# Patient Record
Sex: Female | Born: 1950 | Race: White | Hispanic: No | Smoking: Former smoker
Health system: Southern US, Community
[De-identification: ages and names within clinical notes are randomized; demographics above are authoritative.]

## PROBLEM LIST (undated history)

## (undated) DIAGNOSIS — K635 Polyp of colon: Secondary | ICD-10-CM

## (undated) DIAGNOSIS — F32A Depression, unspecified: Secondary | ICD-10-CM

## (undated) DIAGNOSIS — I10 Essential (primary) hypertension: Secondary | ICD-10-CM

## (undated) DIAGNOSIS — M21619 Bunion of unspecified foot: Secondary | ICD-10-CM

## (undated) DIAGNOSIS — E039 Hypothyroidism, unspecified: Secondary | ICD-10-CM

## (undated) DIAGNOSIS — M1990 Unspecified osteoarthritis, unspecified site: Secondary | ICD-10-CM

## (undated) DIAGNOSIS — N6019 Diffuse cystic mastopathy of unspecified breast: Secondary | ICD-10-CM

## (undated) DIAGNOSIS — E7800 Pure hypercholesterolemia, unspecified: Secondary | ICD-10-CM

## (undated) DIAGNOSIS — E049 Nontoxic goiter, unspecified: Secondary | ICD-10-CM

## (undated) DIAGNOSIS — T7840XA Allergy, unspecified, initial encounter: Secondary | ICD-10-CM

## (undated) DIAGNOSIS — F329 Major depressive disorder, single episode, unspecified: Secondary | ICD-10-CM

## (undated) HISTORY — DX: Nontoxic goiter, unspecified: E04.9

## (undated) HISTORY — DX: Major depressive disorder, single episode, unspecified: F32.9

## (undated) HISTORY — DX: Essential (primary) hypertension: I10

## (undated) HISTORY — DX: Bunion of unspecified foot: M21.619

## (undated) HISTORY — PX: INCISION AND DRAINAGE BREAST ABSCESS: SUR672

## (undated) HISTORY — DX: Diffuse cystic mastopathy of unspecified breast: N60.19

## (undated) HISTORY — DX: Pure hypercholesterolemia, unspecified: E78.00

## (undated) HISTORY — DX: Polyp of colon: K63.5

## (undated) HISTORY — PX: THYROIDECTOMY: SHX17

## (undated) HISTORY — PX: TUBAL LIGATION: SHX77

## (undated) HISTORY — DX: Hypothyroidism, unspecified: E03.9

## (undated) HISTORY — PX: BREAST CYST ASPIRATION: SHX578

## (undated) HISTORY — DX: Depression, unspecified: F32.A

## (undated) HISTORY — DX: Unspecified osteoarthritis, unspecified site: M19.90

## (undated) HISTORY — DX: Allergy, unspecified, initial encounter: T78.40XA

---

## 1983-10-14 HISTORY — PX: BREAST BIOPSY: SHX20

## 1999-05-22 ENCOUNTER — Other Ambulatory Visit: Admission: RE | Admit: 1999-05-22 | Discharge: 1999-05-22 | Payer: Self-pay | Admitting: Family Medicine

## 2000-12-11 ENCOUNTER — Other Ambulatory Visit: Admission: RE | Admit: 2000-12-11 | Discharge: 2000-12-11 | Payer: Self-pay | Admitting: Family Medicine

## 2003-05-05 ENCOUNTER — Other Ambulatory Visit: Admission: RE | Admit: 2003-05-05 | Discharge: 2003-05-05 | Payer: Self-pay | Admitting: Family Medicine

## 2005-02-03 ENCOUNTER — Other Ambulatory Visit: Admission: RE | Admit: 2005-02-03 | Discharge: 2005-02-03 | Payer: Self-pay | Admitting: Family Medicine

## 2005-02-03 ENCOUNTER — Ambulatory Visit: Payer: Self-pay | Admitting: Family Medicine

## 2005-02-17 ENCOUNTER — Ambulatory Visit: Payer: Self-pay | Admitting: Family Medicine

## 2005-02-19 ENCOUNTER — Ambulatory Visit: Payer: Self-pay | Admitting: Family Medicine

## 2007-01-19 ENCOUNTER — Ambulatory Visit: Payer: Self-pay | Admitting: Family Medicine

## 2007-01-19 ENCOUNTER — Encounter: Payer: Self-pay | Admitting: Family Medicine

## 2007-01-19 ENCOUNTER — Other Ambulatory Visit: Admission: RE | Admit: 2007-01-19 | Discharge: 2007-01-19 | Payer: Self-pay | Admitting: Family Medicine

## 2007-01-19 LAB — CONVERTED CEMR LAB: Pap Smear: NORMAL

## 2007-01-22 ENCOUNTER — Ambulatory Visit: Payer: Self-pay | Admitting: Family Medicine

## 2007-01-22 LAB — CONVERTED CEMR LAB
ALT: 16 units/L (ref 0–40)
Alkaline Phosphatase: 81 units/L (ref 39–117)
BUN: 14 mg/dL (ref 6–23)
Basophils Absolute: 0 10*3/uL (ref 0.0–0.1)
Bilirubin, Direct: 0.1 mg/dL (ref 0.0–0.3)
Calcium: 9.4 mg/dL (ref 8.4–10.5)
Cholesterol: 194 mg/dL (ref 0–200)
Eosinophils Absolute: 0.5 10*3/uL (ref 0.0–0.6)
GFR calc Af Amer: 112 mL/min
GFR calc non Af Amer: 92 mL/min
HDL: 50.4 mg/dL (ref >39.0)
Lymphocytes Relative: 35.8 % (ref 12.0–46.0)
MCHC: 33.9 g/dL (ref 30.0–36.0)
MCV: 87 fL (ref 78.0–100.0)
Monocytes Absolute: 0.6 10*3/uL (ref 0.2–0.7)
Monocytes Relative: 7.6 % (ref 3.0–11.0)
Neutro Abs: 4 10*3/uL (ref 1.4–7.7)
Platelets: 260 10*3/uL (ref 150–400)
Potassium: 4.4 meq/L (ref 3.5–5.1)
Triglycerides: 80 mg/dL (ref 0–149)
VLDL: 16 mg/dL (ref 0–40)

## 2007-02-03 LAB — FECAL OCCULT BLOOD, GUAIAC: Fecal Occult Blood: NEGATIVE

## 2007-02-05 ENCOUNTER — Ambulatory Visit: Payer: Self-pay | Admitting: Family Medicine

## 2007-04-07 ENCOUNTER — Encounter: Payer: Self-pay | Admitting: Family Medicine

## 2007-04-07 DIAGNOSIS — F329 Major depressive disorder, single episode, unspecified: Secondary | ICD-10-CM

## 2007-04-07 DIAGNOSIS — N6019 Diffuse cystic mastopathy of unspecified breast: Secondary | ICD-10-CM

## 2007-04-07 DIAGNOSIS — E049 Nontoxic goiter, unspecified: Secondary | ICD-10-CM | POA: Insufficient documentation

## 2007-04-07 DIAGNOSIS — M21619 Bunion of unspecified foot: Secondary | ICD-10-CM

## 2007-04-07 DIAGNOSIS — J45909 Unspecified asthma, uncomplicated: Secondary | ICD-10-CM | POA: Insufficient documentation

## 2007-04-07 DIAGNOSIS — F3289 Other specified depressive episodes: Secondary | ICD-10-CM | POA: Insufficient documentation

## 2007-04-20 ENCOUNTER — Ambulatory Visit: Payer: Self-pay | Admitting: Family Medicine

## 2007-04-20 DIAGNOSIS — I10 Essential (primary) hypertension: Secondary | ICD-10-CM | POA: Insufficient documentation

## 2007-06-25 ENCOUNTER — Ambulatory Visit: Payer: Self-pay | Admitting: Family Medicine

## 2007-08-31 ENCOUNTER — Encounter: Payer: Self-pay | Admitting: Family Medicine

## 2008-05-04 ENCOUNTER — Ambulatory Visit: Payer: Self-pay | Admitting: Family Medicine

## 2008-05-08 LAB — CONVERTED CEMR LAB
Alkaline Phosphatase: 72 units/L (ref 39–117)
Basophils Absolute: 0 10*3/uL (ref 0.0–0.1)
Bilirubin, Direct: 0.1 mg/dL (ref 0.0–0.3)
Calcium: 9.4 mg/dL (ref 8.4–10.5)
Cholesterol: 242 mg/dL (ref 0–200)
GFR calc Af Amer: 95 mL/min
GFR calc non Af Amer: 79 mL/min
Glucose, Bld: 84 mg/dL (ref 70–99)
HCT: 39.9 % (ref 36.0–46.0)
Hemoglobin: 13.7 g/dL (ref 12.0–15.0)
MCHC: 34.2 g/dL (ref 30.0–36.0)
Monocytes Absolute: 0.5 10*3/uL (ref 0.1–1.0)
Monocytes Relative: 6.5 % (ref 3.0–12.0)
Neutro Abs: 4.4 10*3/uL (ref 1.4–7.7)
Platelets: 263 10*3/uL (ref 150–400)
Potassium: 3.9 meq/L (ref 3.5–5.1)
RDW: 12.4 % (ref 11.5–14.6)
Sodium: 140 meq/L (ref 135–145)
Total Bilirubin: 0.7 mg/dL (ref 0.3–1.2)
Total CHOL/HDL Ratio: 5.9
Triglycerides: 145 mg/dL (ref 0–149)
VLDL: 29 mg/dL (ref 0–40)

## 2008-05-18 ENCOUNTER — Ambulatory Visit: Payer: Self-pay | Admitting: Family Medicine

## 2008-05-18 LAB — CONVERTED CEMR LAB
OCCULT 1: NEGATIVE
OCCULT 2: NEGATIVE
OCCULT 3: NEGATIVE

## 2008-05-24 ENCOUNTER — Encounter: Payer: Self-pay | Admitting: Family Medicine

## 2008-06-01 ENCOUNTER — Encounter: Payer: Self-pay | Admitting: Family Medicine

## 2008-09-04 ENCOUNTER — Ambulatory Visit: Payer: Self-pay | Admitting: Family Medicine

## 2008-09-04 DIAGNOSIS — E039 Hypothyroidism, unspecified: Secondary | ICD-10-CM

## 2008-09-04 DIAGNOSIS — E7800 Pure hypercholesterolemia, unspecified: Secondary | ICD-10-CM

## 2008-09-05 LAB — CONVERTED CEMR LAB
ALT: 13 units/L (ref 0–35)
AST: 15 units/L (ref 0–37)
Albumin: 4.3 g/dL (ref 3.5–5.2)
Alkaline Phosphatase: 85 units/L (ref 39–117)
Cholesterol: 244 mg/dL — ABNORMAL HIGH (ref 0–200)
Free T4: 1.55 ng/dL (ref 0.89–1.80)
HDL: 57 mg/dL (ref >39)
LDL Cholesterol: 166 mg/dL — ABNORMAL HIGH (ref 0–99)
Total Protein: 7 g/dL (ref 6.0–8.3)
Triglycerides: 107 mg/dL (ref <150)

## 2009-06-11 ENCOUNTER — Ambulatory Visit: Payer: Self-pay | Admitting: Family Medicine

## 2009-06-11 ENCOUNTER — Other Ambulatory Visit: Admission: RE | Admit: 2009-06-11 | Discharge: 2009-06-11 | Payer: Self-pay | Admitting: Family Medicine

## 2009-06-11 ENCOUNTER — Encounter: Payer: Self-pay | Admitting: Family Medicine

## 2009-06-12 LAB — CONVERTED CEMR LAB
ALT: 15 units/L (ref 0–35)
AST: 17 units/L (ref 0–37)
BUN: 12 mg/dL (ref 6–23)
Basophils Relative: 0.4 % (ref 0.0–3.0)
Bilirubin, Direct: 0 mg/dL (ref 0.0–0.3)
Eosinophils Relative: 3.1 % (ref 0.0–5.0)
GFR calc non Af Amer: 78.46 mL/min (ref >60)
HCT: 40.3 % (ref 36.0–46.0)
HDL: 46.3 mg/dL (ref >39.00)
Monocytes Relative: 6.4 % (ref 3.0–12.0)
Neutrophils Relative %: 61.3 % (ref 43.0–77.0)
Platelets: 230 10*3/uL (ref 150.0–400.0)
Potassium: 4.2 meq/L (ref 3.5–5.1)
RBC: 4.52 M/uL (ref 3.87–5.11)
Total Bilirubin: 0.6 mg/dL (ref 0.3–1.2)
Total CHOL/HDL Ratio: 5
Total Protein: 7.1 g/dL (ref 6.0–8.3)
VLDL: 30.2 mg/dL (ref 0.0–40.0)
WBC: 9.1 10*3/uL (ref 4.5–10.5)

## 2009-06-13 ENCOUNTER — Encounter (INDEPENDENT_AMBULATORY_CARE_PROVIDER_SITE_OTHER): Payer: Self-pay | Admitting: *Deleted

## 2009-06-22 ENCOUNTER — Encounter: Payer: Self-pay | Admitting: Family Medicine

## 2009-06-27 ENCOUNTER — Encounter (INDEPENDENT_AMBULATORY_CARE_PROVIDER_SITE_OTHER): Payer: Self-pay | Admitting: *Deleted

## 2009-08-10 ENCOUNTER — Encounter: Payer: Self-pay | Admitting: Family Medicine

## 2009-08-13 HISTORY — PX: COLONOSCOPY: SHX174

## 2009-08-29 ENCOUNTER — Ambulatory Visit: Payer: Self-pay | Admitting: Gastroenterology

## 2009-08-29 ENCOUNTER — Encounter: Payer: Self-pay | Admitting: Family Medicine

## 2009-08-29 LAB — HM COLONOSCOPY

## 2009-09-12 ENCOUNTER — Ambulatory Visit: Payer: Self-pay | Admitting: Family Medicine

## 2009-09-13 LAB — CONVERTED CEMR LAB
Cholesterol: 226 mg/dL — ABNORMAL HIGH (ref 0–200)
HDL: 51.6 mg/dL (ref >39.00)
Total CHOL/HDL Ratio: 4
Triglycerides: 93 mg/dL (ref 0.0–149.0)
VLDL: 18.6 mg/dL (ref 0.0–40.0)

## 2009-09-18 ENCOUNTER — Telehealth: Payer: Self-pay | Admitting: Family Medicine

## 2010-08-06 ENCOUNTER — Telehealth (INDEPENDENT_AMBULATORY_CARE_PROVIDER_SITE_OTHER): Payer: Self-pay | Admitting: *Deleted

## 2010-08-09 ENCOUNTER — Ambulatory Visit: Payer: Self-pay | Admitting: Family Medicine

## 2010-08-12 LAB — CONVERTED CEMR LAB
ALT: 14 units/L (ref 0–35)
Basophils Relative: 0.4 % (ref 0.0–3.0)
Bilirubin, Direct: 0.1 mg/dL (ref 0.0–0.3)
Chloride: 101 meq/L (ref 96–112)
Creatinine, Ser: 0.9 mg/dL (ref 0.4–1.2)
Direct LDL: 138.8 mg/dL
Eosinophils Relative: 2.9 % (ref 0.0–5.0)
HCT: 40.8 % (ref 36.0–46.0)
Hemoglobin: 13.8 g/dL (ref 12.0–15.0)
MCV: 88.3 fL (ref 78.0–100.0)
Monocytes Absolute: 0.5 10*3/uL (ref 0.1–1.0)
Neutrophils Relative %: 58.1 % (ref 43.0–77.0)
Potassium: 4.9 meq/L (ref 3.5–5.1)
RBC: 4.62 M/uL (ref 3.87–5.11)
Sodium: 138 meq/L (ref 135–145)
Total CHOL/HDL Ratio: 5
Total Protein: 6.9 g/dL (ref 6.0–8.3)
VLDL: 16.2 mg/dL (ref 0.0–40.0)
WBC: 7.5 10*3/uL (ref 4.5–10.5)

## 2010-08-14 ENCOUNTER — Ambulatory Visit: Payer: Self-pay | Admitting: Family Medicine

## 2010-09-25 ENCOUNTER — Encounter: Payer: Self-pay | Admitting: Family Medicine

## 2010-09-26 ENCOUNTER — Encounter: Payer: Self-pay | Admitting: Family Medicine

## 2010-10-22 ENCOUNTER — Ambulatory Visit
Admission: RE | Admit: 2010-10-22 | Discharge: 2010-10-22 | Payer: Self-pay | Source: Home / Self Care | Attending: Family Medicine | Admitting: Family Medicine

## 2010-11-12 NOTE — Progress Notes (Signed)
----   Converted from flag ---- ---- 08/05/2010 7:50 PM, Colon Flattery Tower MD wrote: please check wellness and lipid 272 and v70.0  ---- 08/01/2010 11:51 AM, Liane Comber CMA (AAMA) wrote: Lab orders please! Good Morning! This pt is scheduled for cpx labs Friday, which labs to draw and dx codes to use? Thanks Tasha ------------------------------

## 2010-11-12 NOTE — Assessment & Plan Note (Signed)
Summary: CPX/CLE   Vital Signs:  Patient profile:   60 year old female Height:      64.25 inches Weight:      183.25 pounds BMI:     31.32 Temp:     98.6 degrees F oral Pulse rate:   68 / minute Pulse rhythm:   regular BP sitting:   114 / 74  (left arm) Cuff size:   regular  Vitals Entered By: Lewanda Rife LPN (August 14, 2010 10:39 AM) CC: CPX LMP 10 yrs ago   History of Present Illness: here for health mt exam and to rev chronic med problems   wt is down 12 lb is really working hard on that  healthy diet and exercise  husb dx with Dm -- sugar is out of the house   bp well controlled 114/74 toay  hypothyroid with goiter - endo  nothing new with thyroid  tsh is theraputic   dexa nl in 9/10 ca and D-- is good about that   colonosc - polyp 11/10 adenomatous ? f/u 5 years   pap neg 8/10  no abn paps -- and last 3 have been nl  no new partners or symptoms   mam nl 9/10- wants to set that up self exam - no lumps or problems   /td06 flu shot -- [place] [facility] [place]   lipids this check with trig 81 and HDL 45 and LDL 138 (this is down from 160s !) no more red meat more veg and fruits  all whole grain items      Allergies (verified): No Known Drug Allergies  Past History:  Past Medical History: Last updated: 09/07/2009 Asthma Depression hypertension  bunions hypothyroid- hx of goiter/surg    endo- Dr Juleen China [facility]- [facility]  Past Surgical History: Last updated: 09/07/2009 Tubal ligation Thyroidectomy Breast abscess- right, surgery Thyroid uptake scan- neg (08/1999) Thyroid scan- multinodular goiter (02/2001) Dexa- normal (12/2001) 11/10 colonoscopy polyp  Family History: Last updated: 04/20/2007 Father: mesothelioma Mother: anaplastic thyroid cancer, ? 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 Eyes:  Denies blurring and eye irritation. CV:  Denies chest pain or discomfort, lightheadness, and palpitations. Resp:  Denies cough, shortness of breath, and wheezing. GI:  Denies abdominal pain, bloody stools, change in bowel habits, indigestion, and nausea. GU:  Denies abnormal vaginal bleeding, discharge, dysuria, and urinary frequency. MS:  Denies joint pain, joint redness, joint swelling, muscle aches, and cramps. Derm:  Denies itching, lesion(s), poor wound healing, and rash. Neuro:  Denies numbness and tingling. Psych:  mood is overall good . Endo:  Denies cold intolerance, excessive thirst, excessive urination, and heat intolerance.  Physical Exam  General:  Well-developed,well-nourished,in no acute distress; alert,appropriate and cooperative throughout examination Head:  normocephalic, atraumatic, and no abnormalities observed.   Eyes:  vision grossly intact, pupils equal, pupils round, and pupils reactive to light.  no conjunctival pallor, injection or icterus  Ears:  R ear normal and L ear normal.   Nose:  no nasal discharge.   Mouth:  pharynx pink and moist.   Neck:  supple with full rom and no masses or thyromegally, no JVD or carotid bruit  Chest Wall:  No deformities, masses, or tenderness noted. Breasts:  No mass, nodules, thickening, tenderness, bulging, retraction, inflamation, nipple discharge or skin changes noted.   Lungs:  Normal respiratory effort, chest expands symmetrically. Lungs are clear to auscultation, no crackles or wheezes. Heart:  Normal rate and regular rhythm. S1 and S2 normal without gallop, murmur, click, rub or other extra sounds. Abdomen:  Bowel sounds positive,abdomen soft and non-tender without masses, organomegaly or hernias noted. no renal bruits  Msk:  No  deformity or scoliosis noted of thoracic or lumbar spine.  no acute joint changes  no joint tenderness  Pulses:  R and L carotid,radial,femoral,dorsalis pedis and posterior tibial pulses are full and equal bilaterally Extremities:  No clubbing, cyanosis, edema, or deformity noted with normal full range of motion of all joints.   Neurologic:  sensation intact to light touch, gait normal, and DTRs symmetrical and normal.   Skin:  Intact without suspicious lesions or rashes Cervical Nodes:  No lymphadenopathy noted Axillary Nodes:  No palpable lymphadenopathy Inguinal Nodes:  No significant adenopathy Psych:  normal affect, talkative and pleasant    Impression & Recommendations:  Problem # 1:  HEALTH MAINTENANCE EXAM (ICD-V70.0) Assessment Comment Only reviewed health habits including diet, exercise and skin cancer prevention reviewed health maintenance list and family history commended on wt loss  labs reviewed  pt will get flu shot soon  Problem # 2:  PURE HYPERCHOLESTEROLEMIA (ICD-272.0) Assessment: Improved  improved with diet and exercise - commended on that  rev low sat fat diet - will keep working on that   Labs Reviewed: SGOT: 19 (08/09/2010)   SGPT: 14 (08/09/2010)   HDL:45.90 (08/09/2010), 51.60 (09/12/2009)  LDL:166 (09/04/2008), DEL (05/04/2008)  Chol:207 (08/09/2010), 226 (09/12/2009)  Trig:81.0 (08/09/2010), 93.0 (09/12/2009)  Problem # 3:  OTHER SCREENING MAMMOGRAM (ICD-V76.12) Assessment: Comment Only annual mammogram scheduled adv pt to continue regular self breast exams non remarkable breast exam today  Orders: Radiology Referral (Radiology)  Problem # 4:  UNSPECIFIED HYPOTHYROIDISM (ICD-244.9) Assessment: Comment Only is stable - euthyroid and continues f/u with Dr Juleen China Her updated medication list for this problem includes:    Synthroid 137 Mcg Tabs (Levothyroxine sodium) .Marland Kitchen... Take one by mouth daily  Problem # 5:  HYPERTENSION, BENIGN ESSENTIAL  (ICD-401.1) Assessment: Unchanged  well controlled with low dose ace  no changes  lab rev Her updated medication list for this problem includes:    Zestril 5 Mg Tabs (Lisinopril) .Marland Kitchen... 1 by mouth once daily  BP today: 114/74 Prior BP: 120/82 (06/11/2009)  Labs Reviewed: K+: 4.9 (08/09/2010) Creat: : 0.9 (08/09/2010)   Chol: 207 (08/09/2010)   HDL: 45.90 (08/09/2010)   LDL: 166 (09/04/2008)   TG: 81.0 (08/09/2010)  Problem # 6:  ASTHMA (ICD-493.90) Assessment: Unchanged  renew inhaler for occ exp to dogs  Her updated medication list for this problem includes:    Proventil Hfa 108 (90 Base) Mcg/act Aers (Albuterol sulfate) .Marland Kitchen... 2 puffs up to every 4 hours as needed for wheezing  Orders: Prescription Created Electronically [id number])  Complete Medication List: 1)  Synthroid 137 Mcg Tabs (Levothyroxine sodium) .... Take one by mouth daily 2)  Fish Oil Caps (Omega-3 fatty acids caps) .... Take by mouth as directed 3)  Vitamin C 500 Mg Tabs (Ascorbic acid) .... Take one by mouth twice a day 4)  Calcium 1000 Mg  .... Take one by mouth daily 5)  Zestril 5 Mg Tabs (Lisinopril) .Marland Kitchen.. 1 by mouth once daily 6)  Proventil Hfa 108 (90  Base) Mcg/act Aers (Albuterol sulfate) .... 2 puffs up to every 4 hours as needed for wheezing 7)  Ginkoba 40 Mg Tabs (Ginkgo biloba) .... Take 1 tablet by mouth once a day 8)  Cinsulin Otc (cinnamon Extract)  .... Take 1 tablet by mouth once a day  Patient Instructions: 1)  make sure to get your flu shot  2)  keep up the great work with healthy diet and exercise  3)  cholesterol is looking better 4)  we will schedule mammogram at check out  Prescriptions: ZESTRIL 5 MG  TABS (LISINOPRIL) 1 by mouth once daily  #30 x 11   Entered and Authorized by:   Judith Part MD   Signed by:   Judith Part MD on 08/14/2010   Method used:   Electronically to        [facility]. [address] [phone]* (retail)       [address] [place], [place]  [id number]        Ph: [phone]       Fax: [phone]   RxID:   [id number] PROVENTIL HFA 108 (90 BASE) MCG/ACT  AERS (ALBUTEROL SULFATE) 2 puffs up to every 4 hours as needed for wheezing  #1 mdi x 11   Entered and Authorized by:   Judith Part MD   Signed by:   Judith Part MD on 08/14/2010   Method used:   Electronically to        [facility]. [address] [phone]* (retail)       [address] [place], [place]  [id number]       Ph: [phone]       Fax: [phone]   RxID:   [id number]    Orders Added: 1)  Radiology Referral [Radiology] 2)  Prescription Created Electronically [G8553] 3)  Est. Patient Level IV [70350]    Current Allergies (reviewed today): No known allergies

## 2010-11-14 NOTE — Assessment & Plan Note (Signed)
Summary: WHEEZING,S.O.B.,COUGH/CLE   Vital Signs:  Patient profile:   60 year old female Height:      64.25 inches Weight:      192.25 pounds BMI:     32.86 O2 Sat:      98 % on Room air Temp:     98 degrees F oral Pulse rate:   68 / minute Pulse rhythm:   regular Resp:     16 per minute BP sitting:   122 / 86  (left arm) Cuff size:   large  Vitals Entered By: Delilah Shan CMA Duncan Dull) (October 22, 2010 9:13 AM)  O2 Flow:  Room air CC: Wheezing, SOB, cough  -  worse at night.   History of Present Illness: H/o RAD:  Better today but prev with need for SABA- wheeze triggered by dogs.  Inc time around dogs over the holidays.  Wheeze noted at night.  Also with some congestion in chest.  Using the inhaler with temp relief.  Using it several times day.  Dec in cough in the day relative to nighttime.  No facial congestion.  No FCNAVD, no ear pain.  Didn't get a flu shot when offered at work.    Allergies: No Known Drug Allergies  Past History:  Past Medical History: Asthma- related to pet/dog exposure Depression hypertension  bunions hypothyroid- hx of goiter/surg    endo- Dr Juleen China [facility]- [facility]  Review of Systems       See HPI.  Otherwise negative.    Physical Exam  General:  GEN: nad, alert and oriented HEENT: mucous membranes moist, tm wnl x2 OP wnl NECK: supple w/o LA CV: rrr.  PULM: bilateral exp wheeze but no focal decrease in bs, no increase in wob EXT: no edema SKIN: no acute rash    Impression & Recommendations:  Problem # 1:  ASTHMA (ICD-493.90) Start flovent 1 puff two times a day and rinse after use.  Call back if not improved.  Use SABA as needed in meantime.  She agrees.  We discussed proph vs abortive tx and routine use.  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  She agrees with plan . Her updated medication list for this problem includes:  Proventil Hfa 108 (90 Base) Mcg/act Aers (Albuterol sulfate) .Marland Kitchen... 2 puffs up to every 4 hours as needed for wheezing    Flovent Diskus 250 Mcg/blist Aepb (Fluticasone propionate (inhal)) .Marland Kitchen... 1 puff two times a day and rinse after use  Complete Medication List: 1)  Synthroid 137 Mcg Tabs (Levothyroxine sodium) .... Take one by mouth daily 2)  Fish Oil Caps (Omega-3 fatty acids caps) .... Take by mouth as directed 3)  Vitamin C 500 Mg Tabs (Ascorbic acid) .... Take one by mouth twice a day 4)  Calcium 1000 Mg  .... Take one by mouth daily 5)  Zestril 5 Mg Tabs (Lisinopril) .Marland Kitchen.. 1 by mouth once daily 6)  Proventil Hfa 108 (90 Base) Mcg/act Aers (Albuterol sulfate) .... 2 puffs up to every 4 hours as needed for wheezing 7)  Ginkoba 40 Mg Tabs (Ginkgo biloba) .... Take 1 tablet by mouth once a day 8)  Cinsulin Otc (cinnamon Extract)  .... Take 1 tablet by mouth once a day 9)  Flovent Diskus 250 Mcg/blist Aepb (Fluticasone propionate (inhal)) .Marland Kitchen.. 1 puff two times a day and rinse after use  Patient Instructions: 1)  Start flovent  1 puff two times a day and rinse after use.  Use the albuterol as needed.  Let me know if you aren't improving.  Take care.

## 2010-11-14 NOTE — Letter (Signed)
Summary: Results Follow up Letter  [place] [facility]  [address] [place], [place] [address]   Phone: [phone]  Fax: [phone]    09/26/2010 MRN: [id number]    [facility] [address] [place], [place]  [address]    Dear Ms. Suddeth,  The following are the results of your recent test(s):  Test         Result    Pap Smear:        Normal _____  Not Normal _____ Comments: ______________________________________________________ Cholesterol: LDL(Bad cholesterol):         Your goal is less than:         HDL (Good cholesterol):       Your goal is more than: Comments:  ______________________________________________________ Mammogram:        Normal _X_  Not Normal _____ Comments:Repeat in one year.   ___________________________________________________________________ Hemoccult:        Normal _____  Not normal _______ Comments:    _____________________________________________________________________ Other Tests:    We routinely do not discuss normal results over the telephone.  If you desire a copy of the results, or you have any questions about this information we can discuss them at your next office visit.   Sincerely,    Idamae Schuller Tower,MD  MT/ri

## 2010-11-21 ENCOUNTER — Encounter: Payer: Self-pay | Admitting: Family Medicine

## 2010-11-21 ENCOUNTER — Ambulatory Visit (INDEPENDENT_AMBULATORY_CARE_PROVIDER_SITE_OTHER): Payer: PRIVATE HEALTH INSURANCE | Admitting: Family Medicine

## 2010-11-21 DIAGNOSIS — M25569 Pain in unspecified knee: Secondary | ICD-10-CM

## 2010-11-28 NOTE — Assessment & Plan Note (Signed)
Summary: INJURED KNEE   Vital Signs:  Patient profile:   60 year old female Weight:      192.50 pounds Temp:     98.3 degrees F oral Pulse rate:   78 / minute Pulse rhythm:   regular BP sitting:   110 / 80  (left arm) Cuff size:   large  Vitals Entered By: Selena Batten Dance CMA Duncan Dull) (November 21, 2010 11:55 AM) CC: Left knee pain   History of Present Illness: CC: L knee pain  1 year on and off isues with L knee.  No h/o joint pains prior.  started after dancing on video game.  Used to walk every morning 5 x/wk for several years.  Started dance mix class, felt hurt knee.  This Monday hurt knee again at dance mix class.  Was jumping and turning when felt sharp pain lateral and posterior L knee.  Feels stiffness when standing for long time.  Walking straight no pain.  No popping or locking, no significant instability.  + cracking of knee.  taken ibuprofen 2 pills q4 hours for a few days.  no heat or ice.  No surgeries to knee in past, no fevers/chills.  no smokers at home.    Current Medications (verified): 1)  Synthroid 137 Mcg  Tabs (Levothyroxine Sodium) .... Take One By Mouth Daily 2)  Fish Oil   Caps (Omega-3 Fatty Acids Caps) .... Take By Mouth As Directed 3)  Vitamin C 500 Mg  Tabs (Ascorbic Acid) .... Take One By Mouth Twice A Day 4)  Calcium 1000 Mg .... Take One By Mouth Daily 5)  Zestril 5 Mg  Tabs (Lisinopril) .Marland Kitchen.. 1 By Mouth Once Daily 6)  Proventil Hfa 108 (90 Base) Mcg/act  Aers (Albuterol Sulfate) .... 2 Puffs Up To Every 4 Hours As Needed For Wheezing 7)  Ginkoba 40 Mg Tabs (Ginkgo Biloba) .... Take 1 Tablet By Mouth Once A Day 8)  Cinsulin Otc (Cinnamon Extract) .... 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  neg mcmurray's, neg drawer test, neg patellofemoral grind, no patellar subluxation.  no ligamentous laxity.  no effusion.  no popliteal fullness.  mild crepitus to knees at flexion/extension but full range of motion.  mild instability with twisting and standing on L leg.  feels some discomfort lateral posterior knee with this movement. Pulses:  2+ DP/PT Extremities:  No clubbing, cyanosis, edema, or deformity noted with normal full range of motion of all joints.   Impression & Recommendations:  Problem # 1:  KNEE PAIN, LEFT (ICD-719.46) Assessment New no acute injury, no marked pathology on exam today.  ? mild iliotibial band vs ligament strain vs micro meniscal tear vs early osteoarthritis.  treat for now conservatively with NSAIDs, brace for support.  rest and ice, then slowly start activity again.  if pain returns, return for further eval - consider xrays.  Complete Medication  List: 1)  Synthroid 137 Mcg Tabs (Levothyroxine sodium) .... Take one by mouth daily 2)  Fish Oil Caps (Omega-3 fatty acids caps) .... Take by mouth as directed 3)  Vitamin C 500 Mg Tabs (Ascorbic acid) .... Take one by mouth twice a day 4)  Calcium 1000 Mg  .... Take one by mouth daily 5)  Zestril 5 Mg Tabs (Lisinopril) .Marland Kitchen.. 1 by mouth once daily 6)  Proventil Hfa 108 (90 Base) Mcg/act Aers (Albuterol sulfate) .... 2 puffs up to every 4 hours as needed for wheezing 7)  Ginkoba 40 Mg Tabs (Ginkgo biloba) .... Take 1 tablet by mouth once a day 8)  Cinsulin Otc (cinnamon Extract)  .... Take 1 tablet by mouth once a  day  Patient Instructions: 1)  This could be beginnings of arthritis or micro meniscal tear. 2)  Start with continued ibuprofen as well as using neoprene knee brace when exercising for extra support. 3)  If not improving or if worsening, let us know.   Orders Added: 1)  Est. Patient Level III [96295]    Current Allergies (reviewed today): No known allergies

## 2011-08-14 ENCOUNTER — Telehealth: Payer: Self-pay | Admitting: Family Medicine

## 2011-08-14 DIAGNOSIS — Z0000 Encounter for general adult medical examination without abnormal findings: Secondary | ICD-10-CM | POA: Insufficient documentation

## 2011-08-14 DIAGNOSIS — E7800 Pure hypercholesterolemia, unspecified: Secondary | ICD-10-CM

## 2011-08-14 DIAGNOSIS — E039 Hypothyroidism, unspecified: Secondary | ICD-10-CM

## 2011-08-14 NOTE — Telephone Encounter (Signed)
Message copied by Judy Pimple on Thu Aug 14, 2011  8:55 PM ------      Message from: Baldomero Lamy      Created: Thu Aug 14, 2011  9:38 AM      Regarding: Cpx labs Fri 08/15/11       Please order  future cpx labs for pt's upcomming lab appt.      Thanks      Rodney Booze

## 2011-08-15 ENCOUNTER — Other Ambulatory Visit (INDEPENDENT_AMBULATORY_CARE_PROVIDER_SITE_OTHER): Payer: PRIVATE HEALTH INSURANCE

## 2011-08-15 DIAGNOSIS — E7800 Pure hypercholesterolemia, unspecified: Secondary | ICD-10-CM

## 2011-08-15 DIAGNOSIS — E039 Hypothyroidism, unspecified: Secondary | ICD-10-CM

## 2011-08-15 DIAGNOSIS — Z0000 Encounter for general adult medical examination without abnormal findings: Secondary | ICD-10-CM

## 2011-08-15 LAB — LIPID PANEL
Cholesterol: 224 mg/dL — ABNORMAL HIGH (ref 0–200)
HDL: 56.3 mg/dL (ref >39.00)
Triglycerides: 111 mg/dL (ref 0.0–149.0)
VLDL: 22.2 mg/dL (ref 0.0–40.0)

## 2011-08-15 LAB — CBC WITH DIFFERENTIAL/PLATELET
Basophils Absolute: 0 10*3/uL (ref 0.0–0.1)
Basophils Relative: 0.4 % (ref 0.0–3.0)
HCT: 41.3 % (ref 36.0–46.0)
Hemoglobin: 13.8 g/dL (ref 12.0–15.0)
Lymphocytes Relative: 34.2 % (ref 12.0–46.0)
Lymphs Abs: 2.5 10*3/uL (ref 0.7–4.0)
Monocytes Relative: 8.2 % (ref 3.0–12.0)
Neutro Abs: 3.8 10*3/uL (ref 1.4–7.7)
RBC: 4.61 Mil/uL (ref 3.87–5.11)
RDW: 13.5 % (ref 11.5–14.6)

## 2011-08-15 LAB — COMPREHENSIVE METABOLIC PANEL
ALT: 17 U/L (ref 0–35)
BUN: 15 mg/dL (ref 6–23)
CO2: 27 mEq/L (ref 19–32)
Calcium: 9.3 mg/dL (ref 8.4–10.5)
Chloride: 104 mEq/L (ref 96–112)
Creatinine, Ser: 0.8 mg/dL (ref 0.4–1.2)
GFR: 73.61 mL/min (ref >60.00)
Glucose, Bld: 96 mg/dL (ref 70–99)
Total Bilirubin: 0.6 mg/dL (ref 0.3–1.2)

## 2011-08-15 LAB — LDL CHOLESTEROL, DIRECT: Direct LDL: 151.7 mg/dL

## 2011-08-21 ENCOUNTER — Encounter: Payer: Self-pay | Admitting: Family Medicine

## 2011-08-22 ENCOUNTER — Encounter: Payer: Self-pay | Admitting: Family Medicine

## 2011-08-22 ENCOUNTER — Ambulatory Visit (INDEPENDENT_AMBULATORY_CARE_PROVIDER_SITE_OTHER): Payer: PRIVATE HEALTH INSURANCE | Admitting: Family Medicine

## 2011-08-22 DIAGNOSIS — I10 Essential (primary) hypertension: Secondary | ICD-10-CM

## 2011-08-22 DIAGNOSIS — Z1231 Encounter for screening mammogram for malignant neoplasm of breast: Secondary | ICD-10-CM | POA: Insufficient documentation

## 2011-08-22 DIAGNOSIS — R220 Localized swelling, mass and lump, head: Secondary | ICD-10-CM

## 2011-08-22 DIAGNOSIS — E7800 Pure hypercholesterolemia, unspecified: Secondary | ICD-10-CM

## 2011-08-22 DIAGNOSIS — R221 Localized swelling, mass and lump, neck: Secondary | ICD-10-CM | POA: Insufficient documentation

## 2011-08-22 DIAGNOSIS — Z0000 Encounter for general adult medical examination without abnormal findings: Secondary | ICD-10-CM

## 2011-08-22 DIAGNOSIS — E039 Hypothyroidism, unspecified: Secondary | ICD-10-CM

## 2011-08-22 MED ORDER — LISINOPRIL 5 MG PO TABS: 5.0000 mg | ORAL_TABLET | Freq: Every day | ORAL | Status: DC

## 2011-08-22 MED ORDER — ALBUTEROL SULFATE HFA 108 (90 BASE) MCG/ACT IN AERS: 2.0000 | INHALATION_SPRAY | Freq: Four times a day (QID) | RESPIRATORY_TRACT | Status: DC | PRN

## 2011-08-22 NOTE — Patient Instructions (Addendum)
Avoid red meat/ fried foods/ egg yolks/ fatty breakfast meats/ butter, cheese and high fat dairy/ and shellfish   Change eggs to egg beaters and consider [place] bacon or sausage Lean pork is ok  keep up the good exercise  We will schedule mammogram at check out  If the knot in your neck (I suspect a lymph node) gets bigger or sore let me know  Schedule fasting lab and then follow up in 3 months

## 2011-08-22 NOTE — Assessment & Plan Note (Signed)
theraputic tsh No change in dose No clinical changes

## 2011-08-22 NOTE — Assessment & Plan Note (Signed)
bp in fair control at this time  No changes needed  Disc lifstyle change with low sodium diet and exercise  Labs reviewed  

## 2011-08-22 NOTE — Assessment & Plan Note (Signed)
Small mobile non tender lump in L neck post-lat - resembles reactive LN  Pt will watch F/u 3 mo re check

## 2011-08-22 NOTE — Progress Notes (Signed)
Subjective:    Patient ID: Jodi Steele, female    DOB: 1951/08/16, 60 y.o.   MRN: [id number]  HPI Here for annual health mt exam and also to review chronic medical problems  Has been feeling ok - feeling fine   More asthma this time of year - uses her MDI more often  Uses it once per day average  Some trouble with mechanism of the device   HTN in good control 128/80 on ace  Wt is down 5 lb with bmi of 31 Diet- is eating healthy  Exercise- goes to gym and does floor exercise at home   Lipids Lab Results  Component Value Date   CHOL 224* 08/15/2011   CHOL 207* 08/09/2010   CHOL 226* 09/12/2009   Lab Results  Component Value Date   HDL 56.30 08/15/2011   HDL 40.98 08/09/2010   HDL 51.60 09/12/2009   Lab Results  Component Value Date   LDLCALC 166* 09/04/2008   LDLCALC 128* 01/22/2007   Lab Results  Component Value Date   TRIG 111.0 08/15/2011   TRIG 81.0 08/09/2010   TRIG 93.0 09/12/2009   Lab Results  Component Value Date   CHOLHDL 4 08/15/2011   CHOLHDL 5 08/09/2010   CHOLHDL 4 09/12/2009   Lab Results  Component Value Date   LDLDIRECT 151.7 08/15/2011   LDLDIRECT 138.8 08/09/2010   LDLDIRECT 166.1 09/12/2009   LDL is up generally as is HDL- is exercising more  Does stay away from high sat fat foods  Eats lots of fish / all whole grains   Hypothyroid Lab Results  Component Value Date   TSH 1.64 08/15/2011   theraputic Clinically  No change in dose  Mam 12/11 nl  - wants to go to [facility]  Self exam  No lumps   colonosc polyp 11/10- ? 5 year follow up   Pap 8/10  No abn paps  No new partners  No gyn symptoms   Flu shot- had it oct 18th   Td 06- utd   Patient Active Problem List  Diagnoses  . GOITER  . UNSPECIFIED HYPOTHYROIDISM  . PURE HYPERCHOLESTEROLEMIA  . DEPRESSION  . HYPERTENSION, BENIGN ESSENTIAL  . ASTHMA  . FIBROCYSTIC BREAST DISEASE  . BUNION  . KNEE PAIN, LEFT  . Routine general medical examination at a health care facility    . Other screening mammogram  . Lump in neck   Past Medical History  Diagnosis Date  . Asthma     related to pet/dog exposure  . Depression   . HTN (hypertension)   . Bunion   . Hypothyroidism   . Goiter, unspecified     surgery  . Diffuse cystic mastopathy   . Pure hypercholesterolemia   . Colon polyp    Past Surgical History  Procedure Date  . Tubal ligation   . Thyroidectomy   . Incision and drainage breast abscess     right  . Colonoscopy 11/10    polyp   History  Substance Use Topics  . Smoking status: Former Smoker    Quit date: 10/13/1988  . Smokeless tobacco: Not on file  . Alcohol Use: Yes     Occasional   Family History  Problem Relation Age of Onset  . Other Father     Mesothelioma  . Thyroid cancer Mother     Anaplastic thyroid cancer  . Hypertension Mother   . Thyroid cancer Sister   . Diabetes Mother   . Diabetes  GF   No Known Allergies Current Outpatient Prescriptions on File Prior to Visit  Medication Sig Dispense Refill  . CINNAMON PO Take 1 tablet by mouth daily.        . fish oil-omega-3 fatty acids 1000 MG capsule Take 1 g by mouth daily.       . Ginkgo Biloba 40 MG TABS Take 1 tablet by mouth daily.        Marland Kitchen levothyroxine (SYNTHROID, LEVOTHROID) 137 MCG tablet Take 137 mcg by mouth daily.        . vitamin C (ASCORBIC ACID) 500 MG tablet Take 500 mg by mouth daily.          Review of Systems Review of Systems  Constitutional: Negative for fever, appetite change, fatigue and unexpected weight change.  Eyes: Negative for pain and visual disturbance.  Respiratory: Negative for cough and shortness of breath.   Cardiovascular: Negative for cp or palpitations    Gastrointestinal: Negative for nausea, diarrhea and constipation.  Genitourinary: Negative for urgency and frequency.  Skin: Negative for pallor or rash   MSK pos for knee pain  Neurological: Negative for weakness, light-headedness, numbness and headaches.  Hematological:  Negative for adenopathy. Does not bruise/bleed easily.  Psychiatric/Behavioral: Negative for dysphoric mood. The patient is not nervous/anxious.          Objective:   Physical Exam  Constitutional: She appears well-developed and well-nourished. No distress.       overwt and well appearing   HENT:  Head: Normocephalic and atraumatic.  Mouth/Throat: Oropharynx is clear and moist.  Eyes: Conjunctivae and EOM are normal. Pupils are equal, round, and reactive to light. No scleral icterus.  Neck: Normal range of motion. Neck supple. No JVD present. Carotid bruit is not present. No thyromegaly present.       1 cm mobile lump felt in L post cervical area consistent with LN - nt and mobile   Cardiovascular: Normal rate, regular rhythm, normal heart sounds and intact distal pulses.  Exam reveals no gallop.   Pulmonary/Chest: Effort normal and breath sounds normal. No respiratory distress. She has no wheezes. She exhibits no tenderness.  Abdominal: Soft. Bowel sounds are normal. She exhibits no distension, no abdominal bruit and no mass. There is no tenderness.  Genitourinary: No breast swelling, tenderness, discharge or bleeding.  Musculoskeletal: Normal range of motion. She exhibits no edema and no tenderness.  Lymphadenopathy:    She has cervical adenopathy.  Neurological: She is alert. She has normal reflexes. No cranial nerve deficit. Coordination normal.  Skin: Skin is warm and dry. No rash noted. No erythema. No pallor.  Psychiatric: She has a normal mood and affect.          Assessment & Plan:

## 2011-08-22 NOTE — Assessment & Plan Note (Signed)
Scheduled annual screening mammogram Nl breast exam today  Encouraged monthly self exams   

## 2011-08-22 NOTE — Assessment & Plan Note (Signed)
This is up  Disc goals for lipids and reasons to control them Rev labs with pt Rev low sat fat diet in detail  Re check 3 mo and f/u

## 2011-08-22 NOTE — Assessment & Plan Note (Signed)
Reviewed health habits including diet and exercise and skin cancer prevention Also reviewed health mt list, fam hx and immunizations  Wellness lab rev in detail

## 2011-08-26 ENCOUNTER — Other Ambulatory Visit: Payer: Self-pay | Admitting: Family Medicine

## 2011-10-17 ENCOUNTER — Ambulatory Visit: Payer: Self-pay | Admitting: Family Medicine

## 2011-10-17 ENCOUNTER — Encounter: Payer: Self-pay | Admitting: Family Medicine

## 2011-10-20 ENCOUNTER — Encounter: Payer: Self-pay | Admitting: *Deleted

## 2011-11-16 ENCOUNTER — Telehealth: Payer: Self-pay | Admitting: Family Medicine

## 2011-11-16 DIAGNOSIS — E7800 Pure hypercholesterolemia, unspecified: Secondary | ICD-10-CM

## 2011-11-16 NOTE — Telephone Encounter (Signed)
Lab order

## 2011-11-17 ENCOUNTER — Other Ambulatory Visit (INDEPENDENT_AMBULATORY_CARE_PROVIDER_SITE_OTHER): Payer: PRIVATE HEALTH INSURANCE

## 2011-11-17 DIAGNOSIS — E7800 Pure hypercholesterolemia, unspecified: Secondary | ICD-10-CM

## 2011-11-17 LAB — LIPID PANEL
Total CHOL/HDL Ratio: 3
Triglycerides: 92 mg/dL (ref 0.0–149.0)

## 2011-11-17 LAB — ALT: ALT: 20 U/L (ref 0–35)

## 2011-11-24 ENCOUNTER — Ambulatory Visit: Payer: PRIVATE HEALTH INSURANCE | Admitting: Family Medicine

## 2011-11-28 ENCOUNTER — Ambulatory Visit (INDEPENDENT_AMBULATORY_CARE_PROVIDER_SITE_OTHER): Payer: PRIVATE HEALTH INSURANCE | Admitting: Family Medicine

## 2011-11-28 ENCOUNTER — Encounter: Payer: Self-pay | Admitting: Family Medicine

## 2011-11-28 VITALS — BP 132/82 | HR 72 | Temp 98.0°F | Ht 64.5 in | Wt 195.2 lb

## 2011-11-28 DIAGNOSIS — E039 Hypothyroidism, unspecified: Secondary | ICD-10-CM

## 2011-11-28 DIAGNOSIS — E7800 Pure hypercholesterolemia, unspecified: Secondary | ICD-10-CM

## 2011-11-28 DIAGNOSIS — E669 Obesity, unspecified: Secondary | ICD-10-CM

## 2011-11-28 NOTE — Assessment & Plan Note (Signed)
S/p total thyroidectomy for multinodular goiter Needs management of med No problems Rev last tsh  Will take over lab and refils of this

## 2011-11-28 NOTE — Patient Instructions (Signed)
Cholesterol is better - good job [employer] for saturated fats and keep walking Avoid red meat/ fried foods/ egg yolks/ fatty breakfast meats/ butter, cheese and high fat dairy/ and shellfish   For weight loss- choose healthy foods and cut portions by about 1/4

## 2011-11-28 NOTE — Assessment & Plan Note (Signed)
Improved with some dietary changes At goal of LDL less tha 130 currently - but lower is better Disc goals for lipids and reasons to control them Rev labs with pt Rev low sat fat diet in detail

## 2011-11-28 NOTE — Progress Notes (Signed)
Subjective:    Patient ID: Mickeal Needy, female    DOB: 1951/08/21, 61 y.o.   MRN: [id number]  HPI Here for f/u of high cholesterol  Also new neck pain L side -happened right after she came here -- and came and went (worse in evenings)  Now is some numbness in her neck -- ? Pinched nerve (is right at base of the neck )  This is better now    Thyroid followed by endo - has no thyroid - whole thing removed   (multinodular goiter )  Never had cancer  Same dose forever - checks labs  Wants to start getting labs here now  Lab Results  Component Value Date   TSH 1.64 08/15/2011       Lab Results  Component Value Date   CHOL 208* 11/17/2011   CHOL 224* 08/15/2011   CHOL 207* 08/09/2010   Lab Results  Component Value Date   HDL 61.80 11/17/2011   HDL 56.30 08/15/2011   HDL 21.30 08/09/2010   Lab Results  Component Value Date   LDLCALC 166* 09/04/2008   LDLCALC 128* 01/22/2007   Lab Results  Component Value Date   TRIG 92.0 11/17/2011   TRIG 111.0 08/15/2011   TRIG 81.0 08/09/2010   Lab Results  Component Value Date   CHOLHDL 3 11/17/2011   CHOLHDL 4 08/15/2011   CHOLHDL 5 08/09/2010   Lab Results  Component Value Date   LDLDIRECT 128.3 11/17/2011   LDLDIRECT 151.7 08/15/2011   LDLDIRECT 138.8 08/09/2010   improved significantly from last time  Is working on it  Does take fish oil and exercise -- for HDL  No fam hx of CAD or stroke Father had high trig   Has been eating well - but too many nuts  Had a really good holiday  Has a plan for getting weight off  Will keep exercising   Husband was dx with prostate cancer  He has to go on estrogen -- and he will have to watch diet carefully- and she plans to have the whole family follow through   Patient Active Problem List  Diagnoses  . UNSPECIFIED HYPOTHYROIDISM  . PURE HYPERCHOLESTEROLEMIA  . DEPRESSION  . HYPERTENSION, BENIGN ESSENTIAL  . ASTHMA  . FIBROCYSTIC BREAST DISEASE  . BUNION  . KNEE PAIN, LEFT  .  Routine general medical examination at a health care facility  . Other screening mammogram  . Lump in neck  . Obesity   Past Medical History  Diagnosis Date  . Asthma     related to pet/dog exposure  . Depression   . HTN (hypertension)   . Bunion   . Hypothyroidism   . Goiter, unspecified     surgery  . Diffuse cystic mastopathy   . Pure hypercholesterolemia   . Colon polyp    Past Surgical History  Procedure Date  . Tubal ligation   . Thyroidectomy   . Incision and drainage breast abscess     right  . Colonoscopy 11/10    polyp   History  Substance Use Topics  . Smoking status: Former Smoker    Quit date: 10/13/1988  . Smokeless tobacco: Not on file  . Alcohol Use: Yes     Occasional   Family History  Problem Relation Age of Onset  . Other Father     Mesothelioma  . Thyroid cancer Mother     Anaplastic thyroid cancer  . Hypertension Mother   . Thyroid cancer Sister   .  Diabetes Mother   . Diabetes      GF   No Known Allergies Current Outpatient Prescriptions on File Prior to Visit  Medication Sig Dispense Refill  . Calcium Carbonate (CALCIUM 600 PO) Take 2 capsules by mouth daily.        Marland Kitchen CINNAMON PO Take 1 tablet by mouth daily.        . fish oil-omega-3 fatty acids 1000 MG capsule Take 1 g by mouth daily.       . Ginkgo Biloba 40 MG TABS Take 1 tablet by mouth daily.        Marland Kitchen levothyroxine (SYNTHROID, LEVOTHROID) 137 MCG tablet Take 137 mcg by mouth daily.        Marland Kitchen lisinopril (PRINIVIL,ZESTRIL) 5 MG tablet take 1 tablet by mouth once daily  30 tablet  11  . PROVENTIL HFA 108 (90 BASE) MCG/ACT inhaler inhale 2 puffs UP TO every 4 hours if needed for wheezing  6.7 g  11  . vitamin C (ASCORBIC ACID) 500 MG tablet Take 500 mg by mouth daily.             Review of Systems Review of Systems  Constitutional: Negative for fever, appetite change, fatigue and unexpected weight change.  Eyes: Negative for pain and visual disturbance.  Respiratory: Negative  for cough and shortness of breath.   Cardiovascular: Negative for cp or palpitations    Gastrointestinal: Negative for nausea, diarrhea and constipation.  Genitourinary: Negative for urgency and frequency.  Skin: Negative for pallor or rash   MSK neg for neck pain now/ neg for joint swelling  Neurological: Negative for weakness, light-headedness, numbness and headaches.  Hematological: Negative for adenopathy. Does not bruise/bleed easily.  Psychiatric/Behavioral: Negative for dysphoric mood. The patient is not nervous/anxious.          Objective:   Physical Exam  Constitutional: She appears well-developed and well-nourished. No distress.       Obese and well appearing   HENT:  Head: Normocephalic and atraumatic.  Mouth/Throat: Oropharynx is clear and moist.  Eyes: Conjunctivae and EOM are normal. Pupils are equal, round, and reactive to light. No scleral icterus.       No exophthalmos   Neck: Normal range of motion. Neck supple. No JVD present. No thyromegaly present.       S/p thyroidectomy  Cardiovascular: Normal rate, regular rhythm, normal heart sounds and intact distal pulses.  Exam reveals no gallop.   Pulmonary/Chest: Effort normal and breath sounds normal. No respiratory distress. She has no wheezes. She exhibits no tenderness.  Abdominal: Soft. Bowel sounds are normal. She exhibits no distension and no mass. There is no tenderness.  Musculoskeletal: Normal range of motion. She exhibits no edema and no tenderness.       No neck tenderness today  Lymphadenopathy:    She has no cervical adenopathy.  Neurological: She has normal reflexes. She displays no tremor. No cranial nerve deficit or sensory deficit. She exhibits normal muscle tone.  Skin: Skin is warm and dry. No rash noted. No erythema. No pallor.  Psychiatric: She has a normal mood and affect.          Assessment & Plan:

## 2011-11-28 NOTE — Assessment & Plan Note (Signed)
Discussed how this problem influences overall health and the risks it imposes  Reviewed plan for weight loss with lower calorie diet (via better food choices and also portion control or program like weight watchers) and exercise building up to or more than 30 minutes 5 days per week including some aerobic activity    

## 2012-07-28 ENCOUNTER — Encounter: Payer: Self-pay | Admitting: Family Medicine

## 2012-07-28 ENCOUNTER — Ambulatory Visit (INDEPENDENT_AMBULATORY_CARE_PROVIDER_SITE_OTHER): Payer: PRIVATE HEALTH INSURANCE | Admitting: Family Medicine

## 2012-07-28 VITALS — BP 126/84 | HR 80 | Temp 98.2°F | Ht 64.5 in | Wt 191.8 lb

## 2012-07-28 DIAGNOSIS — Z23 Encounter for immunization: Secondary | ICD-10-CM

## 2012-07-28 DIAGNOSIS — I10 Essential (primary) hypertension: Secondary | ICD-10-CM

## 2012-07-28 DIAGNOSIS — E039 Hypothyroidism, unspecified: Secondary | ICD-10-CM

## 2012-07-28 MED ORDER — LISINOPRIL 5 MG PO TABS: 5.0000 mg | ORAL_TABLET | Freq: Every day | ORAL | Status: DC

## 2012-07-28 MED ORDER — LEVOTHYROXINE SODIUM 137 MCG PO TABS: 137.0000 ug | ORAL_TABLET | Freq: Every day | ORAL | Status: DC

## 2012-07-28 NOTE — Patient Instructions (Addendum)
Tdap vaccine today  I will send your px to your pharmacy

## 2012-07-28 NOTE — Assessment & Plan Note (Signed)
No clinical changes refil med  Lab is planned feb before PE

## 2012-07-28 NOTE — Assessment & Plan Note (Signed)
bp is stable today  No cp or palpitations or headaches or edema  No side effects to medicines  BP Readings from Last 3 Encounters:  07/28/12 126/84  11/28/11 132/82  08/22/11 128/80    Will get labs before PE in feb bp in fair control at this time  No changes needed  Disc lifstyle change with low sodium diet and exercise

## 2012-07-28 NOTE — Progress Notes (Signed)
Subjective:    Patient ID: Jodi Steele, female    DOB: 1951/08/21, 61 y.o.   MRN: [id number]  HPI Here for f/u of chronic conditions Is feeling fine   Nothing new going on   Wt is down 4 lb with bmi of 32   bp is stable today  No cp or palpitations or headaches or edema  No side effects to medicines  BP Readings from Last 3 Encounters:  07/28/12 126/84  11/28/11 132/82  08/22/11 128/80      Hyperlipidemia Was improved last check Lab Results  Component Value Date   CHOL 208* 11/17/2011   HDL 61.80 11/17/2011   LDLCALC 166* 09/04/2008   LDLDIRECT 128.3 11/17/2011   TRIG 92.0 11/17/2011   CHOLHDL 3 11/17/2011   was working on her diet  Eating very well , lots and lots of vegetables   Getting her walking in   Will have flu shot at [employer] fair  She has a new grandchild   Lab Results  Component Value Date   TSH 1.64 08/15/2011     Patient Active Problem List  Diagnosis  . UNSPECIFIED HYPOTHYROIDISM  . PURE HYPERCHOLESTEROLEMIA  . DEPRESSION  . HYPERTENSION, BENIGN ESSENTIAL  . ASTHMA  . FIBROCYSTIC BREAST DISEASE  . BUNION  . KNEE PAIN, LEFT  . Routine general medical examination at a health care facility  . Other screening mammogram  . Lump in neck  . Obesity   Past Medical History  Diagnosis Date  . Asthma     related to pet/dog exposure  . Depression   . HTN (hypertension)   . Bunion   . Hypothyroidism   . Goiter, unspecified     surgery  . Diffuse cystic mastopathy   . Pure hypercholesterolemia   . Colon polyp    Past Surgical History  Procedure Date  . Tubal ligation   . Thyroidectomy   . Incision and drainage breast abscess     right  . Colonoscopy 11/10    polyp   History  Substance Use Topics  . Smoking status: Former Smoker    Quit date: 10/13/1988  . Smokeless tobacco: Not on file  . Alcohol Use: Yes     daily usually wine   Family History  Problem Relation Age of Onset  . Other Father     Mesothelioma  . Thyroid  cancer Mother     Anaplastic thyroid cancer  . Hypertension Mother   . Thyroid cancer Sister   . Diabetes Mother   . Diabetes      GF   No Known Allergies Current Outpatient Prescriptions on File Prior to Visit  Medication Sig Dispense Refill  . Calcium Carbonate (CALCIUM 600 PO) Take 2 capsules by mouth daily.        . Ginkgo Biloba 40 MG TABS Take 1 tablet by mouth daily.        Marland Kitchen levothyroxine (SYNTHROID, LEVOTHROID) 137 MCG tablet Take 1 tablet (137 mcg total) by mouth daily.  30 tablet  11  . lisinopril (PRINIVIL,ZESTRIL) 5 MG tablet Take 1 tablet (5 mg total) by mouth daily.  30 tablet  11  . PROVENTIL HFA 108 (90 BASE) MCG/ACT inhaler inhale 2 puffs UP TO every 4 hours if needed for wheezing  6.7 g  11  . vitamin C (ASCORBIC ACID) 500 MG tablet Take 500 mg by mouth daily.       Marland Kitchen CINNAMON PO Take 1 tablet by mouth daily.        Marland Kitchen  fish oil-omega-3 fatty acids 1000 MG capsule Take 1 g by mouth daily.          Review of Systems Review of Systems  Constitutional: Negative for fever, appetite change, fatigue and unexpected weight change.  Eyes: Negative for pain and visual disturbance.  Respiratory: Negative for cough and shortness of breath.   Cardiovascular: Negative for cp or palpitations    Gastrointestinal: Negative for nausea, diarrhea and constipation.  Genitourinary: Negative for urgency and frequency.  Skin: Negative for pallor or rash   Neurological: Negative for weakness, light-headedness, numbness and headaches.  Hematological: Negative for adenopathy. Does not bruise/bleed easily.  Psychiatric/Behavioral: Negative for dysphoric mood. The patient is not nervous/anxious.         Objective:   Physical Exam  Constitutional: She appears well-developed and well-nourished. No distress.  HENT:  Head: Normocephalic and atraumatic.  Mouth/Throat: Oropharynx is clear and moist.  Eyes: Conjunctivae normal and EOM are normal. Pupils are equal, round, and reactive to light.  No scleral icterus.  Neck: Normal range of motion. Neck supple. No JVD present. Carotid bruit is not present. No thyromegaly present.  Cardiovascular: Normal rate, regular rhythm, normal heart sounds and intact distal pulses.   Pulmonary/Chest: Effort normal and breath sounds normal. No respiratory distress. She has no wheezes.  Musculoskeletal: She exhibits no edema.  Lymphadenopathy:    She has no cervical adenopathy.  Neurological: She is alert. She has normal reflexes.  Skin: Skin is warm and dry.  Psychiatric: She has a normal mood and affect.          Assessment & Plan:

## 2012-09-25 ENCOUNTER — Other Ambulatory Visit: Payer: Self-pay | Admitting: Family Medicine

## 2012-12-03 ENCOUNTER — Other Ambulatory Visit: Payer: PRIVATE HEALTH INSURANCE

## 2012-12-05 ENCOUNTER — Telehealth: Payer: Self-pay | Admitting: Family Medicine

## 2012-12-05 DIAGNOSIS — Z0000 Encounter for general adult medical examination without abnormal findings: Secondary | ICD-10-CM

## 2012-12-05 DIAGNOSIS — E7800 Pure hypercholesterolemia, unspecified: Secondary | ICD-10-CM

## 2012-12-05 NOTE — Telephone Encounter (Signed)
Message copied by Judy Pimple on Sun Dec 05, 2012  1:54 PM ------      Message from: Alvina Chou      Created: Wed Dec 01, 2012 10:29 AM      Regarding: Lab orders for Monday, 2.24.14       Patient is scheduled for CPX labs, please order future labs, Thanks , [name]       ------

## 2012-12-06 ENCOUNTER — Other Ambulatory Visit (INDEPENDENT_AMBULATORY_CARE_PROVIDER_SITE_OTHER): Payer: PRIVATE HEALTH INSURANCE

## 2012-12-06 DIAGNOSIS — E7800 Pure hypercholesterolemia, unspecified: Secondary | ICD-10-CM

## 2012-12-06 DIAGNOSIS — Z0000 Encounter for general adult medical examination without abnormal findings: Secondary | ICD-10-CM

## 2012-12-06 LAB — CBC WITH DIFFERENTIAL/PLATELET
Eosinophils Relative: 4 % (ref 0.0–5.0)
HCT: 40.7 % (ref 36.0–46.0)
Lymphocytes Relative: 37.7 % (ref 12.0–46.0)
Monocytes Relative: 7.1 % (ref 3.0–12.0)
Neutrophils Relative %: 51 % (ref 43.0–77.0)
Platelets: 260 10*3/uL (ref 150.0–400.0)
WBC: 7.7 10*3/uL (ref 4.5–10.5)

## 2012-12-06 LAB — COMPREHENSIVE METABOLIC PANEL
ALT: 27 U/L (ref 0–35)
Albumin: 4.1 g/dL (ref 3.5–5.2)
CO2: 27 mEq/L (ref 19–32)
Chloride: 104 mEq/L (ref 96–112)
GFR: 74.3 mL/min (ref >60.00)
Glucose, Bld: 99 mg/dL (ref 70–99)
Potassium: 4.6 mEq/L (ref 3.5–5.1)
Sodium: 139 mEq/L (ref 135–145)
Total Protein: 6.8 g/dL (ref 6.0–8.3)

## 2012-12-06 LAB — LIPID PANEL: Cholesterol: 197 mg/dL (ref 0–200)

## 2012-12-06 LAB — TSH: TSH: 1.01 u[IU]/mL (ref 0.35–5.50)

## 2012-12-10 ENCOUNTER — Encounter: Payer: Self-pay | Admitting: Family Medicine

## 2012-12-10 ENCOUNTER — Ambulatory Visit (INDEPENDENT_AMBULATORY_CARE_PROVIDER_SITE_OTHER): Payer: PRIVATE HEALTH INSURANCE | Admitting: Family Medicine

## 2012-12-10 ENCOUNTER — Other Ambulatory Visit (HOSPITAL_COMMUNITY)
Admission: RE | Admit: 2012-12-10 | Discharge: 2012-12-10 | Disposition: A | Discharge status: Home / Self Care | Payer: PRIVATE HEALTH INSURANCE | Source: Ambulatory Visit | Attending: Family Medicine | Admitting: Family Medicine

## 2012-12-10 VITALS — BP 120/76 | HR 66 | Temp 98.7°F | Ht 64.25 in | Wt 195.5 lb

## 2012-12-10 DIAGNOSIS — E7800 Pure hypercholesterolemia, unspecified: Secondary | ICD-10-CM

## 2012-12-10 DIAGNOSIS — Z0000 Encounter for general adult medical examination without abnormal findings: Secondary | ICD-10-CM

## 2012-12-10 DIAGNOSIS — Z1151 Encounter for screening for human papillomavirus (HPV): Secondary | ICD-10-CM | POA: Insufficient documentation

## 2012-12-10 DIAGNOSIS — Z01419 Encounter for gynecological examination (general) (routine) without abnormal findings: Secondary | ICD-10-CM | POA: Insufficient documentation

## 2012-12-10 DIAGNOSIS — E039 Hypothyroidism, unspecified: Secondary | ICD-10-CM

## 2012-12-10 DIAGNOSIS — I10 Essential (primary) hypertension: Secondary | ICD-10-CM

## 2012-12-10 MED ORDER — ALBUTEROL SULFATE HFA 108 (90 BASE) MCG/ACT IN AERS: 2.0000 | INHALATION_SPRAY | RESPIRATORY_TRACT | Status: DC | PRN

## 2012-12-10 NOTE — Assessment & Plan Note (Signed)
Reviewed health habits including diet and exercise and skin cancer prevention Also reviewed health mt list, fam hx and immunizations  Rev wellness labs Pt will sched own mam and check into zostavax

## 2012-12-10 NOTE — Assessment & Plan Note (Signed)
Exam with pap done today-no complaints

## 2012-12-10 NOTE — Progress Notes (Signed)
Subjective:    Patient ID: Jodi Steele, female    DOB: 07-30-1951, 62 y.o.   MRN: [id number]  HPI Here for health maintenance exam and to review chronic medical problems    Has been feeling ok  Nothing new going on   Wt is up 4 lb with bmi of 33  Pap-was some time ago in 08 Due for one  No gyn problems Post menopause     mammo 1/13 - will make her own appt at [facility] Self exam-no lumps or changes   Zoster status- will consider vaccine if her ins covers it   Flu shot- got that this fall  Had a couple of colds this year - from her granddaughter   colonosc 11/10 with hyperplastic polyp  bp is stable today  No cp or palpitations or headaches or edema  No side effects to medicines  BP Readings from Last 3 Encounters:  12/10/12 120/76  07/28/12 126/84  11/28/11 132/82       Chemistry      Component Value Date/Time   NA 139 12/06/2012 0850   K 4.6 12/06/2012 0850   CL 104 12/06/2012 0850   CO2 27 12/06/2012 0850   BUN 15 12/06/2012 0850   CREATININE 0.8 12/06/2012 0850      Component Value Date/Time   CALCIUM 9.4 12/06/2012 0850   ALKPHOS 67 12/06/2012 0850   AST 23 12/06/2012 0850   ALT 27 12/06/2012 0850   BILITOT 0.6 12/06/2012 0850       Hyperlipidemia Lab Results  Component Value Date   CHOL 197 12/06/2012   CHOL 208* 11/17/2011   CHOL 224* 08/15/2011   Lab Results  Component Value Date   HDL 45.20 12/06/2012   HDL 40.98 11/17/2011   HDL 56.30 08/15/2011   Lab Results  Component Value Date   LDLCALC 135* 12/06/2012   LDLCALC 166* 09/04/2008   LDLCALC 128* 01/22/2007   Lab Results  Component Value Date   TRIG 84.0 12/06/2012   TRIG 92.0 11/17/2011   TRIG 111.0 08/15/2011   Lab Results  Component Value Date   CHOLHDL 4 12/06/2012   CHOLHDL 3 11/17/2011   CHOLHDL 4 08/15/2011   Lab Results  Component Value Date   LDLDIRECT 128.3 11/17/2011   LDLDIRECT 151.7 08/15/2011   LDLDIRECT 138.8 08/09/2010   did not exercise as much in January    Hypothyroid Lab Results  Component Value Date   TSH 1.01 12/06/2012     Patient Active Problem List  Diagnosis  . UNSPECIFIED HYPOTHYROIDISM  . PURE HYPERCHOLESTEROLEMIA  . DEPRESSION  . HYPERTENSION, BENIGN ESSENTIAL  . ASTHMA  . FIBROCYSTIC BREAST DISEASE  . BUNION  . KNEE PAIN, LEFT  . Routine general medical examination at a health care facility  . Other screening mammogram  . Lump in neck  . Obesity   Past Medical History  Diagnosis Date  . Asthma     related to pet/dog exposure  . Depression   . HTN (hypertension)   . Bunion   . Hypothyroidism   . Goiter, unspecified     surgery  . Diffuse cystic mastopathy   . Pure hypercholesterolemia   . Colon polyp    Past Surgical History  Procedure Laterality Date  . Tubal ligation    . Thyroidectomy    . Incision and drainage breast abscess      right  . Colonoscopy  11/10    polyp   History  Substance Use  Topics  . Smoking status: Former Smoker    Quit date: 10/13/1988  . Smokeless tobacco: Not on file  . Alcohol Use: Yes     Comment: daily usually wine   Family History  Problem Relation Age of Onset  . Other Father     Mesothelioma  . Thyroid cancer Mother     Anaplastic thyroid cancer  . Hypertension Mother   . Thyroid cancer Sister   . Diabetes Mother   . Diabetes      GF   No Known Allergies Current Outpatient Prescriptions on File Prior to Visit  Medication Sig Dispense Refill  . Calcium Carbonate (CALCIUM 600 PO) Take 2 capsules by mouth daily.        . Ginkgo Biloba 40 MG TABS Take 1 tablet by mouth daily.        Marland Kitchen levothyroxine (SYNTHROID, LEVOTHROID) 137 MCG tablet Take 1 tablet (137 mcg total) by mouth daily.  30 tablet  11  . lisinopril (PRINIVIL,ZESTRIL) 5 MG tablet Take 1 tablet (5 mg total) by mouth daily.  30 tablet  11  . PROAIR HFA 108 (90 BASE) MCG/ACT inhaler inhale 2 puffs UP TO every 4 hours if needed for wheezing  6.7 g  3  . vitamin C (ASCORBIC ACID) 500 MG tablet Take  500 mg by mouth daily.        No current facility-administered medications on file prior to visit.    Review of Systems Review of Systems  Constitutional: Negative for fever, appetite change, fatigue and unexpected weight change.  Eyes: Negative for pain and visual disturbance.  Respiratory: Negative for cough and shortness of breath.   Cardiovascular: Negative for cp or palpitations    Gastrointestinal: Negative for nausea, diarrhea and constipation.  Genitourinary: Negative for urgency and frequency.  Skin: Negative for pallor or rash   Neurological: Negative for weakness, light-headedness, numbness and headaches.  Hematological: Negative for adenopathy. Does not bruise/bleed easily.  Psychiatric/Behavioral: Negative for dysphoric mood. The patient is not nervous/anxious.         Objective:   Physical Exam  Constitutional: She appears well-developed and well-nourished. No distress.  obese and well appearing   HENT:  Head: Normocephalic and atraumatic.  Right Ear: External ear normal.  Left Ear: External ear normal.  Mouth/Throat: Oropharynx is clear and moist.  Eyes: Conjunctivae and EOM are normal. Pupils are equal, round, and reactive to light. Right eye exhibits no discharge. Left eye exhibits no discharge. No scleral icterus.  Neck: Normal range of motion. Neck supple. No JVD present. Carotid bruit is not present. No thyromegaly present.  Cardiovascular: Normal rate, regular rhythm, normal heart sounds and intact distal pulses.  Exam reveals no gallop.   Pulmonary/Chest: Effort normal and breath sounds normal. No respiratory distress. She has no wheezes.  Abdominal: Soft. Bowel sounds are normal. She exhibits no distension, no abdominal bruit and no mass. There is no tenderness.  Genitourinary: Vagina normal and uterus normal. No breast swelling, tenderness, discharge or bleeding. There is no rash, tenderness or lesion on the right labia. There is no rash, tenderness or lesion  on the left labia. Uterus is not enlarged and not tender. Cervix exhibits no motion tenderness, no discharge and no friability. Right adnexum displays no mass, no tenderness and no fullness. Left adnexum displays no mass, no tenderness and no fullness. No bleeding around the vagina. No vaginal discharge found.  Breast exam: No mass, nodules, thickening, tenderness, bulging, retraction, inflamation, nipple discharge or skin  changes noted.  No axillary or clavicular LA.  Chaperoned exam.    Musculoskeletal: She exhibits no edema and no tenderness.  Lymphadenopathy:    She has no cervical adenopathy.  Neurological: She is alert. She has normal reflexes. No cranial nerve deficit. She exhibits normal muscle tone. Coordination normal.  Skin: Skin is warm and dry. No rash noted. No erythema. No pallor.  Psychiatric: She has a normal mood and affect.          Assessment & Plan:

## 2012-12-10 NOTE — Assessment & Plan Note (Signed)
Stable but HDL down from less exercise She is back to that now Disc goals for lipids and reasons to control them Rev labs with pt Rev low sat fat diet in detail

## 2012-12-10 NOTE — Patient Instructions (Addendum)
Don't forget to make your annual mammogram appt at [facility] If you are interested in a shingles/zoster vaccine - call your insurance to check on coverage,( you should not get it within 1 month of other vaccines) , then call us for a prescription  for it to take to a pharmacy that gives the shot , or make a nurse visit to get it here depending on your coverage Work on healthy diet and exercise

## 2012-12-10 NOTE — Assessment & Plan Note (Signed)
Hypothyroidism  Pt has no clinical changes No change in energy level/ hair or skin/ edema and no tremor Lab Results  Component Value Date   TSH 1.01 12/06/2012

## 2012-12-10 NOTE — Assessment & Plan Note (Signed)
bp in fair control at this time  No changes needed  Disc lifstyle change with low sodium diet and exercise  Labs reviewed  

## 2012-12-16 ENCOUNTER — Encounter: Payer: Self-pay | Admitting: *Deleted

## 2013-01-06 ENCOUNTER — Encounter: Payer: Self-pay | Admitting: Family Medicine

## 2013-01-06 ENCOUNTER — Encounter: Payer: Self-pay | Admitting: *Deleted

## 2013-01-06 ENCOUNTER — Ambulatory Visit: Payer: Self-pay | Admitting: Family Medicine

## 2013-06-18 ENCOUNTER — Emergency Department: Payer: Self-pay | Admitting: Internal Medicine

## 2013-06-22 ENCOUNTER — Ambulatory Visit: Payer: Self-pay | Admitting: Specialist

## 2013-08-01 ENCOUNTER — Other Ambulatory Visit: Payer: Self-pay | Admitting: Family Medicine

## 2013-12-05 ENCOUNTER — Telehealth: Payer: Self-pay | Admitting: Family Medicine

## 2013-12-05 DIAGNOSIS — Z0000 Encounter for general adult medical examination without abnormal findings: Secondary | ICD-10-CM

## 2013-12-05 NOTE — Telephone Encounter (Signed)
Message copied by Judy PimpleWER, Christion Leonhard A on Mon Dec 05, 2013  8:58 PM ------      Message from: Alvina ChouWALSH, [name] J      Created: Thu Dec 01, 2013 11:05 AM      Regarding: lab orders for Tuesday,2.24.15       Patient is scheduled for CPX labs, please order future labs, Thanks , [name]       ------

## 2013-12-06 ENCOUNTER — Other Ambulatory Visit (INDEPENDENT_AMBULATORY_CARE_PROVIDER_SITE_OTHER): Payer: PRIVATE HEALTH INSURANCE

## 2013-12-06 DIAGNOSIS — E039 Hypothyroidism, unspecified: Secondary | ICD-10-CM

## 2013-12-06 DIAGNOSIS — I10 Essential (primary) hypertension: Secondary | ICD-10-CM

## 2013-12-06 DIAGNOSIS — E7800 Pure hypercholesterolemia, unspecified: Secondary | ICD-10-CM

## 2013-12-06 DIAGNOSIS — Z0000 Encounter for general adult medical examination without abnormal findings: Secondary | ICD-10-CM

## 2013-12-06 LAB — CBC WITH DIFFERENTIAL/PLATELET
Basophils Absolute: 0 10*3/uL (ref 0.0–0.1)
Basophils Relative: 0.3 % (ref 0.0–3.0)
Eosinophils Absolute: 0.4 10*3/uL (ref 0.0–0.7)
Eosinophils Relative: 4.6 % (ref 0.0–5.0)
HEMATOCRIT: 40.8 % (ref 36.0–46.0)
Hemoglobin: 13.8 g/dL (ref 12.0–15.0)
LYMPHS ABS: 2.3 10*3/uL (ref 0.7–4.0)
Lymphocytes Relative: 23.7 % (ref 12.0–46.0)
MCHC: 33.8 g/dL (ref 30.0–36.0)
MCV: 87.9 fl (ref 78.0–100.0)
Monocytes Absolute: 0.7 10*3/uL (ref 0.1–1.0)
Monocytes Relative: 7.3 % (ref 3.0–12.0)
NEUTROS PCT: 64.1 % (ref 43.0–77.0)
Neutro Abs: 6.1 10*3/uL (ref 1.4–7.7)
PLATELETS: 220 10*3/uL (ref 150.0–400.0)
RBC: 4.64 Mil/uL (ref 3.87–5.11)
RDW: 14.2 % (ref 11.5–14.6)
WBC: 9.6 10*3/uL (ref 4.5–10.5)

## 2013-12-06 LAB — COMPREHENSIVE METABOLIC PANEL
ALK PHOS: 73 U/L (ref 39–117)
ALT: 19 U/L (ref 0–35)
AST: 18 U/L (ref 0–37)
Albumin: 4.1 g/dL (ref 3.5–5.2)
BILIRUBIN TOTAL: 0.7 mg/dL (ref 0.3–1.2)
BUN: 14 mg/dL (ref 6–23)
CO2: 29 mEq/L (ref 19–32)
Calcium: 9.4 mg/dL (ref 8.4–10.5)
Chloride: 104 mEq/L (ref 96–112)
Creatinine, Ser: 0.8 mg/dL (ref 0.4–1.2)
GFR: 79.56 mL/min (ref >60.00)
Glucose, Bld: 99 mg/dL (ref 70–99)
Potassium: 4.1 mEq/L (ref 3.5–5.1)
Sodium: 139 mEq/L (ref 135–145)
TOTAL PROTEIN: 7 g/dL (ref 6.0–8.3)

## 2013-12-06 LAB — TSH: TSH: 1.25 u[IU]/mL (ref 0.35–5.50)

## 2013-12-06 LAB — LIPID PANEL
CHOL/HDL RATIO: 4
Cholesterol: 228 mg/dL — ABNORMAL HIGH (ref 0–200)
HDL: 60.3 mg/dL (ref >39.00)
Triglycerides: 117 mg/dL (ref 0.0–149.0)
VLDL: 23.4 mg/dL (ref 0.0–40.0)

## 2013-12-06 LAB — LDL CHOLESTEROL, DIRECT: LDL DIRECT: 144.4 mg/dL

## 2013-12-13 ENCOUNTER — Encounter: Payer: Self-pay | Admitting: Family Medicine

## 2013-12-13 ENCOUNTER — Ambulatory Visit (INDEPENDENT_AMBULATORY_CARE_PROVIDER_SITE_OTHER): Payer: PRIVATE HEALTH INSURANCE | Admitting: Family Medicine

## 2013-12-13 VITALS — BP 112/64 | HR 60 | Temp 97.9°F | Ht 64.0 in | Wt 196.8 lb

## 2013-12-13 DIAGNOSIS — Z0000 Encounter for general adult medical examination without abnormal findings: Secondary | ICD-10-CM

## 2013-12-13 DIAGNOSIS — E669 Obesity, unspecified: Secondary | ICD-10-CM

## 2013-12-13 DIAGNOSIS — I10 Essential (primary) hypertension: Secondary | ICD-10-CM

## 2013-12-13 DIAGNOSIS — E7800 Pure hypercholesterolemia, unspecified: Secondary | ICD-10-CM

## 2013-12-13 DIAGNOSIS — J45909 Unspecified asthma, uncomplicated: Secondary | ICD-10-CM

## 2013-12-13 DIAGNOSIS — E039 Hypothyroidism, unspecified: Secondary | ICD-10-CM

## 2013-12-13 NOTE — Progress Notes (Signed)
Subjective:    Patient ID: Jodi Steele, female    DOB: December 31, 1950, 63 y.o.   MRN: [id number]  HPI Here for health maintenance exam and to review chronic medical problems   Has been feeling well   Getting over her 2nd cold of the season   Wt is up 1 lb - obese  Zoster status- is interested in a vaccine   Mammogram 3/14- due this month (has a letter to schedule)  Self exam-no breast lumps   F/u vaccine - she did not get a vaccine this season (was out of town)  Using her rescue inhaler fairly frequently- at least several times per day  Pap 2/14 nl  No gyn problems    colonosc 11/10 She had a polyp that was removed   Td 10/13   bp is stable today  No cp or palpitations or headaches or edema  No side effects to medicines  BP Readings from Last 3 Encounters:  12/13/13 112/64  12/10/12 120/76  07/28/12 126/84     Hypothyroidism  Pt has no clinical changes No change in energy level/ hair or skin/ edema and no tremor Lab Results  Component Value Date   TSH 1.25 12/06/2013     Hyperlipidemia Lab Results  Component Value Date   CHOL 228* 12/06/2013   CHOL 197 12/06/2012   CHOL 208* 11/17/2011   Lab Results  Component Value Date   HDL 60.30 12/06/2013   HDL 40.98 12/06/2012   HDL 11.91 11/17/2011   Lab Results  Component Value Date   LDLCALC 135* 12/06/2012   LDLCALC 166* 09/04/2008   LDLCALC 128* 01/22/2007   Lab Results  Component Value Date   TRIG 117.0 12/06/2013   TRIG 84.0 12/06/2012   TRIG 92.0 11/17/2011   Lab Results  Component Value Date   CHOLHDL 4 12/06/2013   CHOLHDL 4 12/06/2012   CHOLHDL 3 11/17/2011   Lab Results  Component Value Date   LDLDIRECT 144.4 12/06/2013   LDLDIRECT 128.3 11/17/2011   LDLDIRECT 151.7 08/15/2011   she is exercising- using her fitbit- a lot more active  Ratio is the same  LDL is still a bit too high  Does not watch diet perfectly   She has been watching carbs for diet more so than fats  Tries not to eat processed  food  Cooks at home    Lab Results  Component Value Date   WBC 9.6 12/06/2013   HGB 13.8 12/06/2013   HCT 40.8 12/06/2013   MCV 87.9 12/06/2013   PLT 220.0 12/06/2013      Chemistry      Component Value Date/Time   NA 139 12/06/2013 0800   K 4.1 12/06/2013 0800   CL 104 12/06/2013 0800   CO2 29 12/06/2013 0800   BUN 14 12/06/2013 0800   CREATININE 0.8 12/06/2013 0800      Component Value Date/Time   CALCIUM 9.4 12/06/2013 0800   ALKPHOS 73 12/06/2013 0800   AST 18 12/06/2013 0800   ALT 19 12/06/2013 0800   BILITOT 0.7 12/06/2013 0800      Patient Active Problem List   Diagnosis Date Noted  . Encounter for routine gynecological examination 12/10/2012  . Obesity 11/28/2011  . Other screening mammogram 08/22/2011  . Lump in neck 08/22/2011  . Routine general medical examination at a health care facility 08/14/2011  . UNSPECIFIED HYPOTHYROIDISM 09/04/2008  . PURE HYPERCHOLESTEROLEMIA 09/04/2008  . HYPERTENSION, BENIGN ESSENTIAL 04/20/2007  . DEPRESSION 04/07/2007  .  ASTHMA 04/07/2007  . FIBROCYSTIC BREAST DISEASE 04/07/2007  . BUNION 04/07/2007   Past Medical History  Diagnosis Date  . Asthma     related to pet/dog exposure  . Depression   . HTN (hypertension)   . Bunion   . Hypothyroidism   . Goiter, unspecified     surgery  . Diffuse cystic mastopathy   . Pure hypercholesterolemia   . Colon polyp    Past Surgical History  Procedure Laterality Date  . Tubal ligation    . Thyroidectomy    . Incision and drainage breast abscess      right  . Colonoscopy  11/10    polyp   History  Substance Use Topics  . Smoking status: Former Smoker    Quit date: 10/13/1988  . Smokeless tobacco: Not on file  . Alcohol Use: Yes     Comment: daily usually wine   Family History  Problem Relation Age of Onset  . Other Father     Mesothelioma  . Thyroid cancer Mother     Anaplastic thyroid cancer  . Hypertension Mother   . Thyroid cancer Sister   . Diabetes Mother   .  Diabetes      GF   No Known Allergies Current Outpatient Prescriptions on File Prior to Visit  Medication Sig Dispense Refill  . albuterol (PROAIR HFA) 108 (90 BASE) MCG/ACT inhaler Inhale 2 puffs into the lungs every 4 (four) hours as needed for wheezing.  6.7 g  11  . Calcium Carbonate (CALCIUM 600 PO) Take 2 capsules by mouth daily.        . Cyanocobalamin (VITAMIN B-12 PO) Take 1 tablet by mouth daily.      . Ginkgo Biloba 40 MG TABS Take 1 tablet by mouth daily.        Marland Kitchen. lisinopril (PRINIVIL,ZESTRIL) 5 MG tablet take 1 tablet by mouth once daily  30 tablet  4  . SYNTHROID 137 MCG tablet take 1 tablet by mouth once daily  30 tablet  4  . vitamin C (ASCORBIC ACID) 500 MG tablet Take 500 mg by mouth daily.        No current facility-administered medications on file prior to visit.    Patient Active Problem List   Diagnosis Date Noted  . Encounter for routine gynecological examination 12/10/2012  . Obesity 11/28/2011  . Other screening mammogram 08/22/2011  . Lump in neck 08/22/2011  . Routine general medical examination at a health care facility 08/14/2011  . UNSPECIFIED HYPOTHYROIDISM 09/04/2008  . PURE HYPERCHOLESTEROLEMIA 09/04/2008  . HYPERTENSION, BENIGN ESSENTIAL 04/20/2007  . DEPRESSION 04/07/2007  . ASTHMA 04/07/2007  . FIBROCYSTIC BREAST DISEASE 04/07/2007  . BUNION 04/07/2007   Past Medical History  Diagnosis Date  . Asthma     related to pet/dog exposure  . Depression   . HTN (hypertension)   . Bunion   . Hypothyroidism   . Goiter, unspecified     surgery  . Diffuse cystic mastopathy   . Pure hypercholesterolemia   . Colon polyp    Past Surgical History  Procedure Laterality Date  . Tubal ligation    . Thyroidectomy    . Incision and drainage breast abscess      right  . Colonoscopy  11/10    polyp   History  Substance Use Topics  . Smoking status: Former Smoker    Quit date: 10/13/1988  . Smokeless tobacco: Not on file  . Alcohol Use: Yes  Comment: daily usually wine   Family History  Problem Relation Age of Onset  . Other Father     Mesothelioma  . Thyroid cancer Mother     Anaplastic thyroid cancer  . Hypertension Mother   . Thyroid cancer Sister   . Diabetes Mother   . Diabetes      GF   No Known Allergies Current Outpatient Prescriptions on File Prior to Visit  Medication Sig Dispense Refill  . albuterol (PROAIR HFA) 108 (90 BASE) MCG/ACT inhaler Inhale 2 puffs into the lungs every 4 (four) hours as needed for wheezing.  6.7 g  11  . Calcium Carbonate (CALCIUM 600 PO) Take 2 capsules by mouth daily.        . Cyanocobalamin (VITAMIN B-12 PO) Take 1 tablet by mouth daily.      . Ginkgo Biloba 40 MG TABS Take 1 tablet by mouth daily.        Marland Kitchen lisinopril (PRINIVIL,ZESTRIL) 5 MG tablet take 1 tablet by mouth once daily  30 tablet  4  . SYNTHROID 137 MCG tablet take 1 tablet by mouth once daily  30 tablet  4  . vitamin C (ASCORBIC ACID) 500 MG tablet Take 500 mg by mouth daily.        No current facility-administered medications on file prior to visit.    Review of Systems Review of Systems  Constitutional: Negative for fever, appetite change, fatigue and unexpected weight change.  Eyes: Negative for pain and visual disturbance.  Respiratory: Negative for cough and pos for frequent wheeze/sob  Cardiovascular: Negative for cp or palpitations    Gastrointestinal: Negative for nausea, diarrhea and constipation.  Genitourinary: Negative for urgency and frequency.  Skin: Negative for pallor or rash   Neurological: Negative for weakness, light-headedness, numbness and headaches.  Hematological: Negative for adenopathy. Does not bruise/bleed easily.  Psychiatric/Behavioral: Negative for dysphoric mood. The patient is not nervous/anxious.         Objective:   Physical Exam  Constitutional: She appears well-developed and well-nourished. No distress.  obese and well appearing   HENT:  Head: Normocephalic and  atraumatic.  Right Ear: External ear normal.  Left Ear: External ear normal.  Mouth/Throat: Oropharynx is clear and moist.  Eyes: Conjunctivae and EOM are normal. Pupils are equal, round, and reactive to light. No scleral icterus.  Neck: Normal range of motion. Neck supple. No JVD present. Carotid bruit is not present. No thyromegaly present.  Cardiovascular: Normal rate, regular rhythm, normal heart sounds and intact distal pulses.  Exam reveals no gallop.   Pulmonary/Chest: Effort normal and breath sounds normal. No respiratory distress. She has no wheezes. She exhibits no tenderness.  Abdominal: Soft. Bowel sounds are normal. She exhibits no distension, no abdominal bruit and no mass. There is no tenderness.  Genitourinary: No breast swelling, tenderness, discharge or bleeding.  Breast exam: No mass, nodules, thickening, tenderness, bulging, retraction, inflamation, nipple discharge or skin changes noted.  No axillary or clavicular LA.    Musculoskeletal: Normal range of motion. She exhibits no edema and no tenderness.  Lymphadenopathy:    She has no cervical adenopathy.  Neurological: She is alert. She has normal reflexes. No cranial nerve deficit. She exhibits normal muscle tone. Coordination normal.  Skin: Skin is warm and dry. No rash noted. No erythema. No pallor.  Psychiatric: She has a normal mood and affect.          Assessment & Plan:

## 2013-12-13 NOTE — Patient Instructions (Signed)
I am interested in starting you on a maintenance asthma inhaler to use on a schedule to prevent wheezing (and decrease need for rescue treatment)- please call your insurance co and get me a list of asthma medicines that they cover If you are interested in a shingles/zoster vaccine - call your insurance to check on coverage,( you should not get it within 1 month of other vaccines) , then call us for a prescription  for it to take to a pharmacy that gives the shot , or make a nurse visit to get it here depending on your coverage Don't forget to schedule your annual mammogram  I think you will be due for a colonoscopy in the fall   For cholesterol (Avoid red meat/ fried foods/ egg yolks/ fatty breakfast meats/ butter, cheese and high fat dairy/ and shellfish  )   Fat and Cholesterol Control Diet Fat and cholesterol levels in your blood and organs are influenced by your diet. High levels of fat and cholesterol may lead to diseases of the heart, small and large blood vessels, gallbladder, liver, and pancreas. CONTROLLING FAT AND CHOLESTEROL WITH DIET Although exercise and lifestyle factors are important, your diet is key. That is because certain foods are known to raise cholesterol and others to lower it. The goal is to balance foods for their effect on cholesterol and more importantly, to replace saturated and trans fat with other types of fat, such as monounsaturated fat, polyunsaturated fat, and omega-3 fatty acids. On average, a person should consume no more than 15 to 17 g of saturated fat daily. Saturated and trans fats are considered "bad" fats, and they will raise LDL cholesterol. Saturated fats are primarily found in animal products such as meats, butter, and cream. However, that does not mean you need to give up all your favorite foods. Today, there are good tasting, low-fat, low-cholesterol substitutes for most of the things you like to eat. Choose low-fat or nonfat alternatives. Choose round or  loin cuts of red meat. These types of cuts are lowest in fat and cholesterol. Chicken (without the skin), fish, veal, and ground [place] breast are great choices. Eliminate fatty meats, such as hot dogs and salami. Even shellfish have little or no saturated fat. Have a 3 oz (85 g) portion when you eat lean meat, poultry, or fish. Trans fats are also called "partially hydrogenated oils." They are oils that have been scientifically manipulated so that they are solid at room temperature resulting in a longer shelf life and improved taste and texture of foods in which they are added. Trans fats are found in stick margarine, some tub margarines, cookies, crackers, and baked goods.  When baking and cooking, oils are a great substitute for butter. The monounsaturated oils are especially beneficial since it is believed they lower LDL and raise HDL. The oils you should avoid entirely are saturated tropical oils, such as coconut and palm.  Remember to eat a lot from food groups that are naturally free of saturated and trans fat, including fish, fruit, vegetables, beans, grains (barley, rice, couscous, bulgur wheat), and pasta (without cream sauces).  IDENTIFYING FOODS THAT LOWER FAT AND CHOLESTEROL  Soluble fiber may lower your cholesterol. This type of fiber is found in fruits such as apples, vegetables such as broccoli, potatoes, and carrots, legumes such as beans, peas, and lentils, and grains such as barley. Foods fortified with plant sterols (phytosterol) may also lower cholesterol. You should eat at least 2 g per day of these  foods for a cholesterol lowering effect.  Read package labels to identify low-saturated fats, trans fat free, and low-fat foods at the supermarket. Select cheeses that have only 2 to 3 g saturated fat per ounce. Use a heart-healthy tub margarine that is free of trans fats or partially hydrogenated oil. When buying baked goods (cookies, crackers), avoid partially hydrogenated oils. Breads and  muffins should be made from whole grains (whole-wheat or whole oat flour, instead of "flour" or "enriched flour"). Buy non-creamy canned soups with reduced salt and no added fats.  FOOD PREPARATION TECHNIQUES  Never deep-fry. If you must fry, either stir-fry, which uses very little fat, or use non-stick cooking sprays. When possible, broil, bake, or roast meats, and steam vegetables. Instead of putting butter or margarine on vegetables, use lemon and herbs, applesauce, and cinnamon (for squash and sweet potatoes). Use nonfat yogurt, salsa, and low-fat dressings for salads.  LOW-SATURATED FAT / LOW-FAT FOOD SUBSTITUTES Meats / Saturated Fat (g)  Avoid: Steak, marbled (3 oz/85 g) / 11 g  Choose: Steak, lean (3 oz/85 g) / 4 g  Avoid: Hamburger (3 oz/85 g) / 7 g  Choose: Hamburger, lean (3 oz/85 g) / 5 g  Avoid: Ham (3 oz/85 g) / 6 g  Choose: Ham, lean cut (3 oz/85 g) / 2.4 g  Avoid: Chicken, with skin, dark meat (3 oz/85 g) / 4 g  Choose: Chicken, skin removed, dark meat (3 oz/85 g) / 2 g  Avoid: Chicken, with skin, light meat (3 oz/85 g) / 2.5 g  Choose: Chicken, skin removed, light meat (3 oz/85 g) / 1 g Dairy / Saturated Fat (g)  Avoid: Whole milk (1 cup) / 5 g  Choose: Low-fat milk, 2% (1 cup) / 3 g  Choose: Low-fat milk, 1% (1 cup) / 1.5 g  Choose: Skim milk (1 cup) / 0.3 g  Avoid: Hard cheese (1 oz/28 g) / 6 g  Choose: Skim milk cheese (1 oz/28 g) / 2 to 3 g  Avoid: Cottage cheese, 4% fat (1 cup) / 6.5 g  Choose: Low-fat cottage cheese, 1% fat (1 cup) / 1.5 g  Avoid: Ice cream (1 cup) / 9 g  Choose: Sherbet (1 cup) / 2.5 g  Choose: Nonfat frozen yogurt (1 cup) / 0.3 g  Choose: Frozen fruit bar / trace  Avoid: Whipped cream (1 tbs) / 3.5 g  Choose: Nondairy whipped topping (1 tbs) / 1 g Condiments / Saturated Fat (g)  Avoid: Mayonnaise (1 tbs) / 2 g  Choose: Low-fat mayonnaise (1 tbs) / 1 g  Avoid: Butter (1 tbs) / 7 g  Choose: Extra light margarine (1  tbs) / 1 g  Avoid: Coconut oil (1 tbs) / 11.8 g  Choose: Olive oil (1 tbs) / 1.8 g  Choose: Corn oil (1 tbs) / 1.7 g  Choose: Safflower oil (1 tbs) / 1.2 g  Choose: Sunflower oil (1 tbs) / 1.4 g  Choose: Soybean oil (1 tbs) / 2.4 g  Choose: Canola oil (1 tbs) / 1 g Document Released: 09/29/2005 Document Revised: 01/24/2013 Document Reviewed: 03/20/2011 [employer] Patient Information 2014 [employer] [place].

## 2013-12-13 NOTE — Progress Notes (Signed)
Pre visit review using our clinic review tool, if applicable. No additional management support is needed unless otherwise documented below in the visit note. 

## 2013-12-14 ENCOUNTER — Telehealth: Payer: Self-pay | Admitting: Family Medicine

## 2013-12-14 NOTE — Telephone Encounter (Signed)
Relevant patient education mailed to patient.  

## 2013-12-15 MED ORDER — LISINOPRIL 5 MG PO TABS: ORAL_TABLET | ORAL | Status: DC

## 2013-12-15 MED ORDER — ALBUTEROL SULFATE HFA 108 (90 BASE) MCG/ACT IN AERS: 2.0000 | INHALATION_SPRAY | RESPIRATORY_TRACT | Status: DC | PRN

## 2013-12-15 MED ORDER — LEVOTHYROXINE SODIUM 137 MCG PO TABS: ORAL_TABLET | ORAL | Status: DC

## 2013-12-15 NOTE — Assessment & Plan Note (Signed)
Needs rescue inhaler too often Disc mt steroid inhaler - she would likely benefit  Pt inst to call ins to see what is covered and we will get started with that

## 2013-12-15 NOTE — Assessment & Plan Note (Signed)
Reviewed health habits including diet and exercise and skin cancer prevention Reviewed appropriate screening tests for age  Also reviewed health mt list, fam hx and immunization status , as well as social and family history   See HPI Labs rev Pt will schedule her own mammogram

## 2013-12-15 NOTE — Assessment & Plan Note (Signed)
Disc goals for lipids and reasons to control them Rev labs with pt Rev low sat fat diet in detail Needs to improve diet-handout given

## 2013-12-15 NOTE — Assessment & Plan Note (Signed)
Hypothyroidism  Pt has no clinical changes No change in energy level/ hair or skin/ edema and no tremor Lab Results  Component Value Date   TSH 1.25 12/06/2013

## 2013-12-15 NOTE — Assessment & Plan Note (Signed)
Discussed how this problem influences overall health and the risks it imposes  Reviewed plan for weight loss with lower calorie diet (via better food choices and also portion control or program like weight watchers) and exercise building up to or more than 30 minutes 5 days per week including some aerobic activity    

## 2013-12-15 NOTE — Assessment & Plan Note (Signed)
BP: 112/64 mmHg   bp in fair control at this time  No changes needed Disc lifstyle change with low sodium diet and exercise  Lab reviewed

## 2014-01-04 ENCOUNTER — Telehealth: Payer: Self-pay | Admitting: Family Medicine

## 2014-01-04 NOTE — Telephone Encounter (Signed)
Pt says her [facility] Pt is wanting an rx sen there. Please advise.

## 2014-01-05 MED ORDER — ZOSTER VACCINE LIVE 19400 UNT/0.65ML ~~LOC~~ SOLR: 0.6500 mL | Freq: Once | SUBCUTANEOUS | Status: DC

## 2014-01-05 NOTE — Telephone Encounter (Signed)
Done - sent electronically.

## 2014-01-05 NOTE — Telephone Encounter (Signed)
Pt notified Rx sent 

## 2014-01-09 ENCOUNTER — Encounter: Payer: Self-pay | Admitting: Family Medicine

## 2014-01-09 ENCOUNTER — Ambulatory Visit: Payer: Self-pay | Admitting: Family Medicine

## 2014-01-09 LAB — HM MAMMOGRAPHY: HM Mammogram: NORMAL

## 2014-10-06 ENCOUNTER — Emergency Department: Payer: Self-pay | Admitting: Emergency Medicine

## 2014-12-19 ENCOUNTER — Other Ambulatory Visit: Payer: Self-pay | Admitting: Family Medicine

## 2014-12-19 ENCOUNTER — Other Ambulatory Visit: Payer: Self-pay

## 2014-12-19 MED ORDER — LISINOPRIL 5 MG PO TABS: ORAL_TABLET | ORAL | Status: DC

## 2015-02-03 NOTE — Consult Note (Signed)
PATIENT NAME:  Jodi Steele, Jodi Steele MR#:  [id number] DATE OF BIRTH:  Mar 08, 1951  DATE OF CONSULTATION:  10/06/2014  REFERRING PHYSICIAN:     Loraine LericheMark R. Fanny BienQuale, MD of the Emergency Department  CONSULTING PHYSICIAN:  Ruthe MannanPeter Dillan Candela, MD  CHIEF COMPLAINT: Right shoulder pain.   HISTORY OF PRESENT ILLNESS: The patient is Steele 64 year old female who suffered Steele mechanical fall and she fell on the outstretched right upper extremity. She had immediate pain, inability to move the shoulder. She was brought to the Emergency Department. X-rays were obtained and showed Steele right shoulder dislocation. Given the anatomy of the dislocation, the Emergency Department doctors were uncomfortable with closed reduction, thus I was asked to  perform Steele consultation.   PHYSICAL EXAMINATION:  GENERAL: The patient is supine in the Emergency Department with the shoulder externally rotated and abducted. She has Steele palpable lump in her axilla. She says she has numbness and paresthesias going down the radial aspect of her hand. She has mild ecchymosis.   RADIOGRAPHS: X-rays demonstrate Steele right anteroinferior shoulder dislocation with the shoulder in the abducted position. There is Steele large Hill-Sachs lesion. There is Steele small fragment of uncertain donor site.   PROCEDURE: The following procedure was performed: Closed reduction of right shoulder with manipulation requiring anesthesia. (CPT [id number])  The patient was placed supine on the Emergency Department stretcher. She was then given conscious sedation by the emergency doctors. Following this, traction was then pulled on the arm with countertraction against the chest using Steele sheet. There was Steele palpable clunk as the shoulder relocated. The shoulder was taken through range of motion. It was found to have physiologic range of motion and Steele normal contour of the shoulder. We then obtained orthogonal x-rays, which demonstrated that the shoulder was relocated.   ASSESSMENT: Steele 64 year old female with Steele  right glenohumeral shoulder dislocation with Steele large of Hill-Sachs lesion, now stable following closed reduction.   PLAN: She should maintain Steele sling for 2 weeks, and [facility] post shoulder dislocation care.    ____________________________ Ruthe MannanPeter Jeffrie Lofstrom, MD pa:ts D: 10/08/2014 21:02:00 ET T: 10/08/2014 21:39:55 ET JOB#: [id number]  cc: Ruthe MannanPeter Jezabel Lecker, MD, <Dictator> Althea GrimmerPeter J. Mickle PlumbApel, MD PhD Orthopaedic Surgery ELECTRONICALLY SIGNED 10/09/2014 0:42

## 2015-06-20 ENCOUNTER — Telehealth: Payer: Self-pay | Admitting: Family Medicine

## 2015-06-20 DIAGNOSIS — Z0000 Encounter for general adult medical examination without abnormal findings: Secondary | ICD-10-CM

## 2015-06-20 NOTE — Telephone Encounter (Signed)
-----   Message from [name] J Walsh sent at 06/14/2015  4:03 PM EDT ----- Regarding: Lab orders for Thursday, 9.8.16 Patient is scheduled for CPX labs, please order future labs, Thanks , [name]  

## 2015-06-21 ENCOUNTER — Other Ambulatory Visit (INDEPENDENT_AMBULATORY_CARE_PROVIDER_SITE_OTHER): Payer: PRIVATE HEALTH INSURANCE

## 2015-06-21 DIAGNOSIS — Z0000 Encounter for general adult medical examination without abnormal findings: Secondary | ICD-10-CM

## 2015-06-21 LAB — LIPID PANEL
CHOL/HDL RATIO: 4
Cholesterol: 219 mg/dL — ABNORMAL HIGH (ref 0–200)
HDL: 54.9 mg/dL (ref >39.00)
LDL Cholesterol: 145 mg/dL — ABNORMAL HIGH (ref 0–99)
NONHDL: 164.05
Triglycerides: 97 mg/dL (ref 0.0–149.0)
VLDL: 19.4 mg/dL (ref 0.0–40.0)

## 2015-06-21 LAB — CBC WITH DIFFERENTIAL/PLATELET
BASOS ABS: 0 10*3/uL (ref 0.0–0.1)
Basophils Relative: 0.4 % (ref 0.0–3.0)
EOS ABS: 0.3 10*3/uL (ref 0.0–0.7)
Eosinophils Relative: 3.9 % (ref 0.0–5.0)
HCT: 40.7 % (ref 36.0–46.0)
Hemoglobin: 13.5 g/dL (ref 12.0–15.0)
LYMPHS ABS: 2.4 10*3/uL (ref 0.7–4.0)
Lymphocytes Relative: 32.9 % (ref 12.0–46.0)
MCHC: 33.2 g/dL (ref 30.0–36.0)
MCV: 87 fl (ref 78.0–100.0)
MONO ABS: 0.6 10*3/uL (ref 0.1–1.0)
MONOS PCT: 8.3 % (ref 3.0–12.0)
NEUTROS PCT: 54.5 % (ref 43.0–77.0)
Neutro Abs: 4 10*3/uL (ref 1.4–7.7)
Platelets: 223 10*3/uL (ref 150.0–400.0)
RBC: 4.68 Mil/uL (ref 3.87–5.11)
RDW: 13.8 % (ref 11.5–15.5)
WBC: 7.3 10*3/uL (ref 4.0–10.5)

## 2015-06-21 LAB — TSH: TSH: 0.69 u[IU]/mL (ref 0.35–4.50)

## 2015-06-21 LAB — COMPREHENSIVE METABOLIC PANEL
ALK PHOS: 85 U/L (ref 39–117)
ALT: 13 U/L (ref 0–35)
AST: 13 U/L (ref 0–37)
Albumin: 4.2 g/dL (ref 3.5–5.2)
BILIRUBIN TOTAL: 0.5 mg/dL (ref 0.2–1.2)
BUN: 18 mg/dL (ref 6–23)
CO2: 31 meq/L (ref 19–32)
Calcium: 9.3 mg/dL (ref 8.4–10.5)
Chloride: 103 mEq/L (ref 96–112)
Creatinine, Ser: 0.75 mg/dL (ref 0.40–1.20)
GFR: 82.83 mL/min (ref >60.00)
GLUCOSE: 105 mg/dL — AB (ref 70–99)
Potassium: 4.6 mEq/L (ref 3.5–5.1)
SODIUM: 139 meq/L (ref 135–145)
TOTAL PROTEIN: 6.7 g/dL (ref 6.0–8.3)

## 2015-06-27 ENCOUNTER — Ambulatory Visit (INDEPENDENT_AMBULATORY_CARE_PROVIDER_SITE_OTHER): Payer: PRIVATE HEALTH INSURANCE | Admitting: Family Medicine

## 2015-06-27 ENCOUNTER — Encounter: Payer: Self-pay | Admitting: Family Medicine

## 2015-06-27 VITALS — BP 122/80 | HR 68 | Temp 98.4°F | Ht 64.0 in | Wt 192.8 lb

## 2015-06-27 DIAGNOSIS — E7800 Pure hypercholesterolemia, unspecified: Secondary | ICD-10-CM

## 2015-06-27 DIAGNOSIS — Z0000 Encounter for general adult medical examination without abnormal findings: Secondary | ICD-10-CM | POA: Diagnosis not present

## 2015-06-27 DIAGNOSIS — I10 Essential (primary) hypertension: Secondary | ICD-10-CM

## 2015-06-27 DIAGNOSIS — Z1211 Encounter for screening for malignant neoplasm of colon: Secondary | ICD-10-CM

## 2015-06-27 DIAGNOSIS — E890 Postprocedural hypothyroidism: Secondary | ICD-10-CM

## 2015-06-27 DIAGNOSIS — R221 Localized swelling, mass and lump, neck: Secondary | ICD-10-CM

## 2015-06-27 DIAGNOSIS — E669 Obesity, unspecified: Secondary | ICD-10-CM

## 2015-06-27 MED ORDER — ALBUTEROL SULFATE HFA 108 (90 BASE) MCG/ACT IN AERS: INHALATION_SPRAY | RESPIRATORY_TRACT | Status: DC

## 2015-06-27 MED ORDER — LEVOTHYROXINE SODIUM 137 MCG PO TABS: 137.0000 ug | ORAL_TABLET | Freq: Every day | ORAL | Status: DC

## 2015-06-27 MED ORDER — LISINOPRIL 5 MG PO TABS: ORAL_TABLET | ORAL | Status: DC

## 2015-06-27 NOTE — Progress Notes (Signed)
Pre visit review using our clinic review tool, if applicable. No additional management support is needed unless otherwise documented below in the visit note. 

## 2015-06-27 NOTE — Progress Notes (Signed)
Subjective:    Patient ID: Jodi Steele, female    DOB: 03/14/1951, 64 y.o.   MRN: [id number]  HPI Here for health maintenance exam and to review chronic medical problems    Larey Seat at [place] -trip and fall- she broke her shoulder (big injury) Shoulder still not right- limited rom/ weakness  She continues to do a lot of gardening however   Also has a skin spot on her lip- bump at border of L upper lip  Then scabbed up  Also a bump on L middle finger -not painful   Doing ok overall  Had a really busy summer (work)   [employer] is down 4 lb with bmi of 33 Continues to do well with wt loss  Is eating healthy and taking care of herself    Hep C/HIV screening - low risk - declines screening   Zoster vaccine - had it last March   Mammogram 3/15 nl -not up to date - could not schedule her appt until she was seen  Self exam-has not noticed any lumps   Flu shot-will get at [facility] fair   Pap 2/14 normal No abn paps - all nl since having kids  No symptoms    Colonoscopy 11/10 - adenomatous polyp- likely due for recall   Td 10/13   Hyperlipidemia Lab Results  Component Value Date   CHOL 219* 06/21/2015   CHOL 228* 12/06/2013   CHOL 197 12/06/2012   Lab Results  Component Value Date   HDL 54.90 06/21/2015   HDL 60.30 12/06/2013   HDL 45.20 12/06/2012   Lab Results  Component Value Date   LDLCALC 145* 06/21/2015   LDLCALC 135* 12/06/2012   LDLCALC 166* 09/04/2008   Lab Results  Component Value Date   TRIG 97.0 06/21/2015   TRIG 117.0 12/06/2013   TRIG 84.0 12/06/2012   Lab Results  Component Value Date   CHOLHDL 4 06/21/2015   CHOLHDL 4 12/06/2013   CHOLHDL 4 12/06/2012   Lab Results  Component Value Date   LDLDIRECT 144.4 12/06/2013   LDLDIRECT 128.3 11/17/2011   LDLDIRECT 151.7 08/15/2011  LDL is up a bit - 10 points  Does not eat fried food/fast food  A lot of fish , some shellfish  Some red meat    bp is stable today  No cp or  palpitations or headaches or edema  No side effects to medicines  BP Readings from Last 3 Encounters:  06/27/15 130/90  12/13/13 112/64  12/10/12 120/76     Hypothyroid (post op) Hypothyroidism  Pt has no clinical changes No change in energy level/ hair or skin/ edema and no tremor Lab Results  Component Value Date   TSH 0.69 06/21/2015     Patient Active Problem List   Diagnosis Date Noted  . Encounter for routine gynecological examination 12/10/2012  . Obesity 11/28/2011  . Other screening mammogram 08/22/2011  . Lump in neck 08/22/2011  . Routine general medical examination at a health care facility 08/14/2011  . Hypothyroidism 09/04/2008  . PURE HYPERCHOLESTEROLEMIA 09/04/2008  . HYPERTENSION, BENIGN ESSENTIAL 04/20/2007  . DEPRESSION 04/07/2007  . ASTHMA 04/07/2007  . FIBROCYSTIC BREAST DISEASE 04/07/2007  . BUNION 04/07/2007   Past Medical History  Diagnosis Date  . Asthma     related to pet/dog exposure  . Depression   . HTN (hypertension)   . Bunion   . Hypothyroidism   . Goiter, unspecified     surgery  . Diffuse  cystic mastopathy   . Pure hypercholesterolemia   . Colon polyp    Past Surgical History  Procedure Laterality Date  . Tubal ligation    . Thyroidectomy    . Incision and drainage breast abscess      right  . Colonoscopy  11/10    polyp   Social History  Substance Use Topics  . Smoking status: Former Smoker    Quit date: 10/13/1988  . Smokeless tobacco: None  . Alcohol Use: 0.0 oz/week    0 Standard drinks or equivalent per week     Comment: daily usually wine   Family History  Problem Relation Age of Onset  . Other Father     Mesothelioma  . Thyroid cancer Mother     Anaplastic thyroid cancer  . Hypertension Mother   . Thyroid cancer Sister   . Diabetes Mother   . Diabetes      GF   No Known Allergies Current Outpatient Prescriptions on File Prior to Visit  Medication Sig Dispense Refill  . Calcium Carbonate (CALCIUM 600  PO) Take 2 capsules by mouth daily.      . Ginkgo Biloba 40 MG TABS Take 1 tablet by mouth daily.      Marland Kitchen lisinopril (PRINIVIL,ZESTRIL) 5 MG tablet take 1 tablet by mouth once daily 90 tablet 3  . meloxicam (MOBIC) 15 MG tablet Take 15 mg by mouth daily as needed for pain.    Marland Kitchen PROAIR HFA 108 (90 BASE) MCG/ACT inhaler inhale 2 puffs INTO THE LUNGS EVERY 4 HOURS AS NEEDED FOR WHEEZING 8.5 g 11  . SYNTHROID 137 MCG tablet take 1 tablet by mouth once daily 90 tablet 3  . vitamin C (ASCORBIC ACID) 500 MG tablet Take 500 mg by mouth daily.      No current facility-administered medications on file prior to visit.     Review of Systems Review of Systems  Constitutional: Negative for fever, appetite change, fatigue and unexpected weight change.  Eyes: Negative for pain and visual disturbance.  Respiratory: Negative for cough and shortness of breath.   Cardiovascular: Negative for cp or palpitations    Gastrointestinal: Negative for nausea, diarrhea and constipation.  Genitourinary: Negative for urgency and frequency.  Skin: Negative for pallor or rash  pos for scab above L upper lip Neurological: Negative for weakness, light-headedness, numbness and headaches.  Hematological: Negative for adenopathy. Does not bruise/bleed easily.  Psychiatric/Behavioral: Negative for dysphoric mood. The patient is not nervous/anxious.         Objective:   Physical Exam  Constitutional: She appears well-developed and well-nourished. No distress.  overwt and well app  HENT:  Head: Normocephalic and atraumatic.  Right Ear: External ear normal.  Left Ear: External ear normal.  Mouth/Throat: Oropharynx is clear and moist.  Eyes: Conjunctivae and EOM are normal. Pupils are equal, round, and reactive to light. No scleral icterus.  Neck: Normal range of motion. Neck supple. No JVD present. Carotid bruit is not present. No thyromegaly present.  Stable lump in L post cervical area resembling LN  nt    Cardiovascular: Normal rate, regular rhythm, normal heart sounds and intact distal pulses.  Exam reveals no gallop.   Pulmonary/Chest: Effort normal and breath sounds normal. No respiratory distress. She has no wheezes. She exhibits no tenderness.  Abdominal: Soft. Bowel sounds are normal. She exhibits no distension, no abdominal bruit and no mass. There is no tenderness.  Genitourinary: No breast swelling, tenderness, discharge or bleeding.  Breast  exam: No mass, nodules, thickening, tenderness, bulging, retraction, inflamation, nipple discharge or skin changes noted.  No axillary or clavicular LA.      Musculoskeletal: Normal range of motion. She exhibits no edema or tenderness.  Lymphadenopathy:    She has no cervical adenopathy.    She has no axillary adenopathy.       Right: No inguinal, no supraclavicular and no epitrochlear adenopathy present.       Left: No inguinal, no supraclavicular and no epitrochlear adenopathy present.  Neurological: She is alert. She has normal reflexes. No cranial nerve deficit. She exhibits normal muscle tone. Coordination normal.  Skin: Skin is warm and dry. No rash noted. No erythema. No pallor.  Small scab at L upper lip border  Non tender  Lentigo diffusely   Psychiatric: She has a normal mood and affect.          Assessment & Plan:   Problem List Items Addressed This Visit      Cardiovascular and Mediastinum   HYPERTENSION, BENIGN ESSENTIAL - Primary    bp in fair control at this time  BP Readings from Last 1 Encounters:  06/27/15 122/80   No changes needed Disc lifstyle change with low sodium diet and exercise  Labs reviewed       Relevant Medications   lisinopril (PRINIVIL,ZESTRIL) 5 MG tablet     Endocrine   Hypothyroidism    Hypothyroidism  Pt has no clinical changes No change in energy level/ hair or skin/ edema and no tremor Lab Results  Component Value Date   TSH 0.69 06/21/2015          Relevant Medications    levothyroxine (SYNTHROID) 137 MCG tablet     Other   Colon cancer screening    Pt had adenomatous polyp in 2010  Ref for 5 y recall (if appropriate)- dr Bluford Kaufmann      Relevant Orders   Ambulatory referral to Gastroenterology   Lump in neck    This is still present/unchanged and nt  No other lumps  Nl labs Will re check at 1 mo f/u      Obesity    Discussed how this problem influences overall health and the risks it imposes  Reviewed plan for weight loss with lower calorie diet (via better food choices and also portion control or program like weight watchers) and exercise building up to or more than 30 minutes 5 days per week including some aerobic activity   Commended on further wt loss and effort       PURE HYPERCHOLESTEROLEMIA    Disc goals for lipids and reasons to control them Rev labs with pt Rev low sat fat diet in detail LDL is up -despite good diet Will work on limiting beef Continue to follow  Consider statin if this continues to rise       Relevant Medications   lisinopril (PRINIVIL,ZESTRIL) 5 MG tablet   Routine general medical examination at a health care facility    Reviewed health habits including diet and exercise and skin cancer prevention Reviewed appropriate screening tests for age  Also reviewed health mt list, fam hx and immunization status , as well as social and family history   See HPI Labs reviewed Stop at check out for colonoscopy referral  For cholesterol (Avoid red meat/ fried foods/ egg yolks/ fatty breakfast meats/ butter, cheese and high fat dairy/ and shellfish)-esp cut back on red meat  Get your flu shot as planned   Will  f/u in about a mo to re check the spot on her lip

## 2015-06-27 NOTE — Assessment & Plan Note (Signed)
Hypothyroidism  Pt has no clinical changes No change in energy level/ hair or skin/ edema and no tremor Lab Results  Component Value Date   TSH 0.69 06/21/2015

## 2015-06-27 NOTE — Assessment & Plan Note (Signed)
Pt had adenomatous polyp in 2010  Ref for 5 y recall (if appropriate)- dr Bluford Kaufmann

## 2015-06-27 NOTE — Assessment & Plan Note (Signed)
bp in fair control at this time  BP Readings from Last 1 Encounters:  06/27/15 122/80   No changes needed Disc lifstyle change with low sodium diet and exercise  Labs reviewed

## 2015-06-27 NOTE — Assessment & Plan Note (Signed)
Disc goals for lipids and reasons to control them Rev labs with pt Rev low sat fat diet in detail LDL is up -despite good diet Will work on limiting beef Continue to follow  Consider statin if this continues to rise

## 2015-06-27 NOTE — Assessment & Plan Note (Signed)
Discussed how this problem influences overall health and the risks it imposes  Reviewed plan for weight loss with lower calorie diet (via better food choices and also portion control or program like weight watchers) and exercise building up to or more than 30 minutes 5 days per week including some aerobic activity   Commended on further wt loss and effort

## 2015-06-27 NOTE — Patient Instructions (Signed)
Stop at check out for colonoscopy referral  For cholesterol (Avoid red meat/ fried foods/ egg yolks/ fatty breakfast meats/ butter, cheese and high fat dairy/ and shellfish)-esp cut back on red meat  Get your flu shot as planned    Follow up in 1 month for re check of lymph node and spot on lip

## 2015-06-27 NOTE — Assessment & Plan Note (Signed)
This is still present/unchanged and nt  No other lumps  Nl labs Will re check at 1 mo f/u

## 2015-06-27 NOTE — Assessment & Plan Note (Signed)
Reviewed health habits including diet and exercise and skin cancer prevention Reviewed appropriate screening tests for age  Also reviewed health mt list, fam hx and immunization status , as well as social and family history   See HPI Labs reviewed Stop at check out for colonoscopy referral  For cholesterol (Avoid red meat/ fried foods/ egg yolks/ fatty breakfast meats/ butter, cheese and high fat dairy/ and shellfish)-esp cut back on red meat  Get your flu shot as planned   Will f/u in about a mo to re check the spot on her lip

## 2015-07-10 ENCOUNTER — Other Ambulatory Visit: Payer: Self-pay | Admitting: Family Medicine

## 2015-07-10 DIAGNOSIS — Z1231 Encounter for screening mammogram for malignant neoplasm of breast: Secondary | ICD-10-CM

## 2015-07-19 ENCOUNTER — Ambulatory Visit
Admission: RE | Admit: 2015-07-19 | Discharge: 2015-07-19 | Disposition: A | Discharge status: Home / Self Care | Payer: PRIVATE HEALTH INSURANCE | Source: Ambulatory Visit | Attending: Family Medicine | Admitting: Family Medicine

## 2015-07-19 DIAGNOSIS — Z1231 Encounter for screening mammogram for malignant neoplasm of breast: Secondary | ICD-10-CM | POA: Diagnosis present

## 2015-07-19 LAB — HM MAMMOGRAPHY: HM MAMMO: NORMAL

## 2015-07-20 ENCOUNTER — Encounter: Payer: Self-pay | Admitting: *Deleted

## 2015-07-20 ENCOUNTER — Encounter: Payer: Self-pay | Admitting: Family Medicine

## 2015-07-27 ENCOUNTER — Encounter: Payer: Self-pay | Admitting: Family Medicine

## 2015-07-27 ENCOUNTER — Ambulatory Visit (INDEPENDENT_AMBULATORY_CARE_PROVIDER_SITE_OTHER): Payer: PRIVATE HEALTH INSURANCE | Admitting: Family Medicine

## 2015-07-27 ENCOUNTER — Encounter (INDEPENDENT_AMBULATORY_CARE_PROVIDER_SITE_OTHER): Payer: Self-pay

## 2015-07-27 VITALS — BP 130/84 | HR 65 | Temp 98.1°F | Ht 64.0 in | Wt 193.0 lb

## 2015-07-27 DIAGNOSIS — R221 Localized swelling, mass and lump, neck: Secondary | ICD-10-CM

## 2015-07-27 DIAGNOSIS — L989 Disorder of the skin and subcutaneous tissue, unspecified: Secondary | ICD-10-CM

## 2015-07-27 NOTE — Patient Instructions (Signed)
Please stop at check out and ask our referral coordinator about your colonoscopy- tell them you have not heard back from [facility]  We will watch the scar area above your lip   We will refer you to ENT to check out the lump (I suspect lymph node) in neck

## 2015-07-27 NOTE — Progress Notes (Signed)
Subjective:    Patient ID: Jodi Steele, female    DOB: 1951-08-23, 64 y.o.   MRN: [id number]  HPI  Here for re check of spot in neck L post cervical Pt does not notice it  Not sore or tender  No LN noticed   Also a spot (was scaley) on her lip border  This has turned into a tiny "divot" -not bothersome   Feels fine  No fever or uri symptoms or skin infections No issues with her scalp  Patient Active Problem List   Diagnosis Date Noted  . Skin lesion of face 07/29/2015  . Colon cancer screening 06/27/2015  . Encounter for routine gynecological examination 12/10/2012  . Obesity 11/28/2011  . Other screening mammogram 08/22/2011  . Lump in neck 08/22/2011  . Routine general medical examination at a health care facility 08/14/2011  . Hypothyroidism 09/04/2008  . PURE HYPERCHOLESTEROLEMIA 09/04/2008  . HYPERTENSION, BENIGN ESSENTIAL 04/20/2007  . DEPRESSION 04/07/2007  . ASTHMA 04/07/2007  . FIBROCYSTIC BREAST DISEASE 04/07/2007  . BUNION 04/07/2007   Past Medical History  Diagnosis Date  . Asthma     related to pet/dog exposure  . Depression   . HTN (hypertension)   . Bunion   . Hypothyroidism   . Goiter, unspecified     surgery  . Diffuse cystic mastopathy   . Pure hypercholesterolemia   . Colon polyp    Past Surgical History  Procedure Laterality Date  . Tubal ligation    . Thyroidectomy    . Incision and drainage breast abscess      right  . Colonoscopy  11/10    polyp  . Breast biopsy Right     1985 abcsess  . Breast cyst aspiration Left     negative   Social History  Substance Use Topics  . Smoking status: Former Smoker    Quit date: 10/13/1988  . Smokeless tobacco: None  . Alcohol Use: 0.0 oz/week    0 Standard drinks or equivalent per week     Comment: daily usually wine   Family History  Problem Relation Age of Onset  . Other Father     Mesothelioma  . Thyroid cancer Mother     Anaplastic thyroid cancer  . Hypertension Mother    . Thyroid cancer Sister   . Diabetes Mother   . Diabetes      GF   No Known Allergies Current Outpatient Prescriptions on File Prior to Visit  Medication Sig Dispense Refill  . albuterol (PROAIR HFA) 108 (90 BASE) MCG/ACT inhaler inhale 2 puffs INTO THE LUNGS EVERY 4 HOURS AS NEEDED FOR WHEEZING 8.5 g 11  . Calcium Carbonate (CALCIUM 600 PO) Take 2 capsules by mouth daily.      . Ginkgo Biloba 40 MG TABS Take 1 tablet by mouth daily.      Marland Kitchen levothyroxine (SYNTHROID) 137 MCG tablet Take 1 tablet (137 mcg total) by mouth daily. 90 tablet 3  . lisinopril (PRINIVIL,ZESTRIL) 5 MG tablet take 1 tablet by mouth once daily 90 tablet 3  . meloxicam (MOBIC) 15 MG tablet Take 15 mg by mouth daily as needed for pain.    . vitamin C (ASCORBIC ACID) 500 MG tablet Take 500 mg by mouth daily.      No current facility-administered medications on file prior to visit.    Review of Systems Review of Systems  Constitutional: Negative for fever, appetite change, fatigue and unexpected weight change.  Eyes: Negative for pain  and visual disturbance.  Respiratory: Negative for cough and shortness of breath.   Cardiovascular: Negative for cp or palpitations    Gastrointestinal: Negative for nausea, diarrhea and constipation.  Genitourinary: Negative for urgency and frequency.  Skin: Negative for pallor or rash  pos for tiny skin indentation above L upper lip  Neurological: Negative for weakness, light-headedness, numbness and headaches.  Hematological: Negative for adenopathy. Does not bruise/bleed easily.  Psychiatric/Behavioral: Negative for dysphoric mood. The patient is not nervous/anxious.         Objective:   Physical Exam  Constitutional: She appears well-developed and well-nourished. No distress.  obese and well appearing   HENT:  Head: Normocephalic and atraumatic.  Right Ear: External ear normal.  Left Ear: External ear normal.  Nose: Nose normal.  Mouth/Throat: Oropharynx is clear and  moist.  Eyes: Conjunctivae and EOM are normal. Pupils are equal, round, and reactive to light. Right eye exhibits no discharge. Left eye exhibits no discharge. No scleral icterus.  Neck: Normal range of motion. Neck supple.  102 cm oval lump found in L post cervical area that is mobile and nt   Cardiovascular: Normal rate and regular rhythm.   Pulmonary/Chest: Effort normal and breath sounds normal. No respiratory distress. She has no wheezes. She has no rales.  Musculoskeletal: She exhibits no edema or tenderness.  Lymphadenopathy:       Head (right side): No submental, no submandibular, no tonsillar, no preauricular, no posterior auricular and no occipital adenopathy present.       Head (left side): No submental, no submandibular, no tonsillar, no preauricular, no posterior auricular and no occipital adenopathy present.    She has cervical adenopathy.       Right cervical: No superficial cervical, no deep cervical and no posterior cervical adenopathy present.      Left cervical: Posterior cervical adenopathy present. No superficial cervical and no deep cervical adenopathy present.    She has no axillary adenopathy.       Right: No inguinal, no supraclavicular and no epitrochlear adenopathy present.       Left: No inguinal, no supraclavicular and no epitrochlear adenopathy present.  Skin: No rash noted. No erythema. No pallor.  2 mm indentation (resembling scar) just above L upper lip  nt  No erythema or scale   Psychiatric: She has a normal mood and affect.          Assessment & Plan:   Problem List Items Addressed This Visit      Musculoskeletal and Integument   Skin lesion of face    Area of previous scale just above L upper lip is now a 2 mm indentation (resembling a scar)-no further scale  Will watch this for change  Pt will update if itching or scabbing or other symptoms         Other   Lump in neck - Primary    Palpable 1-2 cm oval lump felt in post L neck - mobile  and nt  Suspect this is a LN  No other adenopathy Ref to ENT for eval since it is not going away      Relevant Orders   Ambulatory referral to ENT

## 2015-07-27 NOTE — Progress Notes (Signed)
Pre visit review using our clinic review tool, if applicable. No additional management support is needed unless otherwise documented below in the visit note. 

## 2015-07-29 DIAGNOSIS — L989 Disorder of the skin and subcutaneous tissue, unspecified: Secondary | ICD-10-CM | POA: Insufficient documentation

## 2015-07-29 NOTE — Assessment & Plan Note (Signed)
Area of previous scale just above L upper lip is now a 2 mm indentation (resembling a scar)-no further scale  Will watch this for change  Pt will update if itching or scabbing or other symptoms

## 2015-07-29 NOTE — Assessment & Plan Note (Signed)
Palpable 1-2 cm oval lump felt in post L neck - mobile and nt  Suspect this is a LN  No other adenopathy Ref to ENT for eval since it is not going away

## 2016-06-30 ENCOUNTER — Telehealth: Payer: Self-pay | Admitting: Family Medicine

## 2016-06-30 ENCOUNTER — Other Ambulatory Visit (INDEPENDENT_AMBULATORY_CARE_PROVIDER_SITE_OTHER): Payer: Managed Care, Other (non HMO)

## 2016-06-30 DIAGNOSIS — Z0000 Encounter for general adult medical examination without abnormal findings: Secondary | ICD-10-CM

## 2016-06-30 LAB — CBC WITH DIFFERENTIAL/PLATELET
BASOS ABS: 0 10*3/uL (ref 0.0–0.1)
Basophils Relative: 0.5 % (ref 0.0–3.0)
EOS PCT: 3.6 % (ref 0.0–5.0)
Eosinophils Absolute: 0.3 10*3/uL (ref 0.0–0.7)
HCT: 40.8 % (ref 36.0–46.0)
HEMOGLOBIN: 13.7 g/dL (ref 12.0–15.0)
LYMPHS ABS: 2.5 10*3/uL (ref 0.7–4.0)
LYMPHS PCT: 31.3 % (ref 12.0–46.0)
MCHC: 33.5 g/dL (ref 30.0–36.0)
MCV: 87.1 fl (ref 78.0–100.0)
MONOS PCT: 7.3 % (ref 3.0–12.0)
Monocytes Absolute: 0.6 10*3/uL (ref 0.1–1.0)
NEUTROS PCT: 57.3 % (ref 43.0–77.0)
Neutro Abs: 4.6 10*3/uL (ref 1.4–7.7)
Platelets: 248 10*3/uL (ref 150.0–400.0)
RBC: 4.69 Mil/uL (ref 3.87–5.11)
RDW: 14.1 % (ref 11.5–15.5)
WBC: 8.1 10*3/uL (ref 4.0–10.5)

## 2016-06-30 LAB — COMPREHENSIVE METABOLIC PANEL
ALBUMIN: 4 g/dL (ref 3.5–5.2)
ALK PHOS: 86 U/L (ref 39–117)
ALT: 14 U/L (ref 0–35)
AST: 14 U/L (ref 0–37)
BILIRUBIN TOTAL: 0.3 mg/dL (ref 0.2–1.2)
BUN: 15 mg/dL (ref 6–23)
CO2: 30 mEq/L (ref 19–32)
Calcium: 9.2 mg/dL (ref 8.4–10.5)
Chloride: 104 mEq/L (ref 96–112)
Creatinine, Ser: 0.78 mg/dL (ref 0.40–1.20)
GFR: 78.91 mL/min (ref >60.00)
GLUCOSE: 96 mg/dL (ref 70–99)
Potassium: 4.5 mEq/L (ref 3.5–5.1)
Sodium: 140 mEq/L (ref 135–145)
TOTAL PROTEIN: 7.1 g/dL (ref 6.0–8.3)

## 2016-06-30 LAB — LIPID PANEL
CHOLESTEROL: 231 mg/dL — AB (ref 0–200)
HDL: 51 mg/dL (ref >39.00)
LDL Cholesterol: 163 mg/dL — ABNORMAL HIGH (ref 0–99)
NONHDL: 180.13
Total CHOL/HDL Ratio: 5
Triglycerides: 87 mg/dL (ref 0.0–149.0)
VLDL: 17.4 mg/dL (ref 0.0–40.0)

## 2016-06-30 LAB — TSH: TSH: 1.13 u[IU]/mL (ref 0.35–4.50)

## 2016-06-30 NOTE — Telephone Encounter (Signed)
-----   Message from Alvina Chouerri J Walsh sent at 06/30/2016  7:45 AM EDT ----- Regarding: Lab orders asap Patient is scheduled for CPX labs, please order future labs, Thanks , Camelia Engerri   Thank you!

## 2016-07-01 ENCOUNTER — Encounter: Payer: PRIVATE HEALTH INSURANCE | Admitting: Family Medicine

## 2016-07-08 ENCOUNTER — Ambulatory Visit (INDEPENDENT_AMBULATORY_CARE_PROVIDER_SITE_OTHER): Payer: Managed Care, Other (non HMO) | Admitting: Family Medicine

## 2016-07-08 ENCOUNTER — Other Ambulatory Visit (HOSPITAL_COMMUNITY)
Admission: RE | Admit: 2016-07-08 | Discharge: 2016-07-08 | Disposition: A | Discharge status: Home / Self Care | Payer: Managed Care, Other (non HMO) | Source: Ambulatory Visit | Attending: Family Medicine | Admitting: Family Medicine

## 2016-07-08 ENCOUNTER — Encounter: Payer: Self-pay | Admitting: Family Medicine

## 2016-07-08 VITALS — BP 118/82 | HR 70 | Temp 98.1°F | Ht 63.75 in | Wt 187.8 lb

## 2016-07-08 DIAGNOSIS — E890 Postprocedural hypothyroidism: Secondary | ICD-10-CM

## 2016-07-08 DIAGNOSIS — Z1151 Encounter for screening for human papillomavirus (HPV): Secondary | ICD-10-CM | POA: Insufficient documentation

## 2016-07-08 DIAGNOSIS — E669 Obesity, unspecified: Secondary | ICD-10-CM

## 2016-07-08 DIAGNOSIS — E7800 Pure hypercholesterolemia, unspecified: Secondary | ICD-10-CM | POA: Diagnosis not present

## 2016-07-08 DIAGNOSIS — I10 Essential (primary) hypertension: Secondary | ICD-10-CM

## 2016-07-08 DIAGNOSIS — Z01419 Encounter for gynecological examination (general) (routine) without abnormal findings: Secondary | ICD-10-CM | POA: Diagnosis present

## 2016-07-08 DIAGNOSIS — Z0000 Encounter for general adult medical examination without abnormal findings: Secondary | ICD-10-CM

## 2016-07-08 MED ORDER — ALBUTEROL SULFATE HFA 108 (90 BASE) MCG/ACT IN AERS: INHALATION_SPRAY | RESPIRATORY_TRACT | 11 refills | Status: DC

## 2016-07-08 MED ORDER — LEVOTHYROXINE SODIUM 137 MCG PO TABS: 137.0000 ug | ORAL_TABLET | Freq: Every day | ORAL | 3 refills | Status: DC

## 2016-07-08 MED ORDER — LISINOPRIL 5 MG PO TABS: ORAL_TABLET | ORAL | 3 refills | Status: DC

## 2016-07-08 NOTE — Progress Notes (Signed)
Subjective:    Patient ID: Jodi Steele, female    DOB: 07/10/1951, 65 y.o.   MRN: [id number]  HPI Here for health maintenance exam and to review chronic medical problems    Feeling fine  Nothing new going on  Just got back from a cruise - walked a lot and did eat - but ended up not gaining weight    Wt Readings from Last 3 Encounters:  07/08/16 187 lb 12 oz (85.2 kg)  07/27/15 193 lb (87.5 kg)  06/27/15 192 lb 12 oz (87.4 kg)  working on weight loss- likes using her fit bit  Very active Eats very well - lot of fruit and veg and quality proteins bmi is 32.4  Pap 2014- has not had one yet  No gyn problems  Will get today  Flu shot-gets them through work   Mammogram 10/16-will schedule at [facility]  Self breast exam-no lumps or changes   Colonoscopy 11/10-polyps- ? Of recall/changed it   Tetanus shot 10/13 Zoster vac 3/15  bp is stable today  No cp or palpitations or headaches or edema  No side effects to medicines  BP Readings from Last 3 Encounters:  07/08/16 118/82  07/27/15 130/84  06/27/15 122/80     Hypothyroidism  Pt has no clinical changes No change in energy level/ hair or skin/ edema and no tremor Lab Results  Component Value Date   TSH 1.13 06/30/2016     Hx of hyperlipidemia Lab Results  Component Value Date   CHOL 231 (H) 06/30/2016   CHOL 219 (H) 06/21/2015   CHOL 228 (H) 12/06/2013   Lab Results  Component Value Date   HDL 51.00 06/30/2016   HDL 54.90 06/21/2015   HDL 60.30 12/06/2013   Lab Results  Component Value Date   LDLCALC 163 (H) 06/30/2016   LDLCALC 145 (H) 06/21/2015   LDLCALC 135 (H) 12/06/2012   Lab Results  Component Value Date   TRIG 87.0 06/30/2016   TRIG 97.0 06/21/2015   TRIG 117.0 12/06/2013   Lab Results  Component Value Date   CHOLHDL 5 06/30/2016   CHOLHDL 4 06/21/2015   CHOLHDL 4 12/06/2013   Lab Results  Component Value Date   LDLDIRECT 144.4 12/06/2013   LDLDIRECT 128.3 11/17/2011   LDLDIRECT 151.7 08/15/2011    Pt states she ate a lot of cheese on her vacation - LDL is up   Results for orders placed or performed in visit on 06/30/16  CBC with Differential/Platelet  Result Value Ref Range   WBC 8.1 4.0 - 10.5 K/uL   RBC 4.69 3.87 - 5.11 Mil/uL   Hemoglobin 13.7 12.0 - 15.0 g/dL   HCT 40.9 81.1 - 91.4 %   MCV 87.1 78.0 - 100.0 fl   MCHC 33.5 30.0 - 36.0 g/dL   RDW 78.2 95.6 - 21.3 %   Platelets 248.0 150.0 - 400.0 K/uL   Neutrophils Relative % 57.3 43.0 - 77.0 %   Lymphocytes Relative 31.3 12.0 - 46.0 %   Monocytes Relative 7.3 3.0 - 12.0 %   Eosinophils Relative 3.6 0.0 - 5.0 %   Basophils Relative 0.5 0.0 - 3.0 %   Neutro Abs 4.6 1.4 - 7.7 K/uL   Lymphs Abs 2.5 0.7 - 4.0 K/uL   Monocytes Absolute 0.6 0.1 - 1.0 K/uL   Eosinophils Absolute 0.3 0.0 - 0.7 K/uL   Basophils Absolute 0.0 0.0 - 0.1 K/uL  Comprehensive metabolic panel  Result Value Ref Range  Sodium 140 135 - 145 mEq/L   Potassium 4.5 3.5 - 5.1 mEq/L   Chloride 104 96 - 112 mEq/L   CO2 30 19 - 32 mEq/L   Glucose, Bld 96 70 - 99 mg/dL   BUN 15 6 - 23 mg/dL   Creatinine, Ser 4.090.78 0.40 - 1.20 mg/dL   Total Bilirubin 0.3 0.2 - 1.2 mg/dL   Alkaline Phosphatase 86 39 - 117 U/L   AST 14 0 - 37 U/L   ALT 14 0 - 35 U/L   Total Protein 7.1 6.0 - 8.3 g/dL   Albumin 4.0 3.5 - 5.2 g/dL   Calcium 9.2 8.4 - 81.110.5 mg/dL   GFR 91.4778.91 >82.95>60.00 mL/min  Lipid panel  Result Value Ref Range   Cholesterol 231 (H) 0 - 200 mg/dL   Triglycerides 62.187.0 0.0 - 149.0 mg/dL   HDL 30.8651.00 >57.84>39.00 mg/dL   VLDL 69.617.4 0.0 - 29.540.0 mg/dL   LDL Cholesterol 284163 (H) 0 - 99 mg/dL   Total CHOL/HDL Ratio 5    NonHDL 180.13   TSH  Result Value Ref Range   TSH 1.13 0.35 - 4.50 uIU/mL    Patient Active Problem List   Diagnosis Date Noted  . Skin lesion of face 07/29/2015  . Colon cancer screening 06/27/2015  . Encounter for routine gynecological examination 12/10/2012  . Obesity 11/28/2011  . Other screening mammogram 08/22/2011    . Lump in neck 08/22/2011  . Routine general medical examination at a health care facility 08/14/2011  . Hypothyroidism 09/04/2008  . PURE HYPERCHOLESTEROLEMIA 09/04/2008  . HYPERTENSION, BENIGN ESSENTIAL 04/20/2007  . DEPRESSION 04/07/2007  . ASTHMA 04/07/2007  . FIBROCYSTIC BREAST DISEASE 04/07/2007  . BUNION 04/07/2007   Past Medical History:  Diagnosis Date  . Asthma    related to pet/dog exposure  . Bunion   . Colon polyp   . Depression   . Diffuse cystic mastopathy   . Goiter, unspecified    surgery  . HTN (hypertension)   . Hypothyroidism   . Pure hypercholesterolemia    Past Surgical History:  Procedure Laterality Date  . BREAST BIOPSY Right    1985 abcsess  . BREAST CYST ASPIRATION Left    negative  . COLONOSCOPY  11/10   polyp  . INCISION AND DRAINAGE BREAST ABSCESS     right  . THYROIDECTOMY    . TUBAL LIGATION     Social History  Substance Use Topics  . Smoking status: Former Smoker    Quit date: 10/13/1988  . Smokeless tobacco: Never Used  . Alcohol use 0.0 oz/week     Comment: daily usually wine   Family History  Problem Relation Age of Onset  . Other Father     Mesothelioma  . Thyroid cancer Mother     Anaplastic thyroid cancer  . Hypertension Mother   . Thyroid cancer Sister   . Diabetes Mother   . Diabetes      GF   No Known Allergies Current Outpatient Prescriptions on File Prior to Visit  Medication Sig Dispense Refill  . Calcium Carbonate (CALCIUM 600 PO) Take 2 capsules by mouth daily.      . Ginkgo Biloba 40 MG TABS Take 1 tablet by mouth daily.      . vitamin C (ASCORBIC ACID) 500 MG tablet Take 500 mg by mouth daily.      No current facility-administered medications on file prior to visit.      Review of Systems  Review of Systems  Constitutional: Negative for fever, appetite change, fatigue and unexpected weight change.  Eyes: Negative for pain and visual disturbance.  Respiratory: Negative for cough and shortness of  breath.   Cardiovascular: Negative for cp or palpitations    Gastrointestinal: Negative for nausea, diarrhea and constipation.  Genitourinary: Negative for urgency and frequency.  Skin: Negative for pallor or rash   Neurological: Negative for weakness, light-headedness, numbness and headaches.  Hematological: Negative for adenopathy. Does not bruise/bleed easily.  Psychiatric/Behavioral: Negative for dysphoric mood. The patient is not nervous/anxious.      Objective:   Physical Exam  Constitutional: She appears well-developed and well-nourished. No distress.  obese and well appearing   HENT:  Head: Normocephalic and atraumatic.  Right Ear: External ear normal.  Left Ear: External ear normal.  Mouth/Throat: Oropharynx is clear and moist.  Eyes: Conjunctivae and EOM are normal. Pupils are equal, round, and reactive to light. No scleral icterus.  Neck: Normal range of motion. Neck supple. No JVD present. Carotid bruit is not present. No thyromegaly present.  Cardiovascular: Normal rate, regular rhythm, normal heart sounds and intact distal pulses.  Exam reveals no gallop.   Pulmonary/Chest: Effort normal and breath sounds normal. No respiratory distress. She has no wheezes. She exhibits no tenderness.  Abdominal: Soft. Bowel sounds are normal. She exhibits no distension, no abdominal bruit and no mass. There is no tenderness.  Genitourinary: No breast swelling, tenderness, discharge or bleeding.  Genitourinary Comments: Breast exam: No mass, nodules, thickening, tenderness, bulging, retraction, inflamation, nipple discharge or skin changes noted.  No axillary or clavicular LA.             Anus appears normal w/o hemorrhoids or masses     External genitalia : nl appearance and hair distribution/no lesions     Urethral meatus : nl size, no lesions or prolapse     Urethra: no masses, tenderness or scarring    Bladder : no masses or tenderness     Vagina: nl general  appearance, no discharge or  Lesions, no significant cystocele  or rectocele     Cervix: no lesions/ discharge or friability    Uterus: nl size, contour, position, and mobility (not fixed) , non tender    Adnexa : no masses, tenderness, enlargement or nodularity        Musculoskeletal: Normal range of motion. She exhibits no edema or tenderness.  Lymphadenopathy:    She has no cervical adenopathy.  Neurological: She is alert. She has normal reflexes. No cranial nerve deficit. She exhibits normal muscle tone. Coordination normal.  Skin: Skin is warm and dry. No rash noted. No erythema. No pallor.  Lentigines noted on trunk  Psychiatric: She has a normal mood and affect.          Assessment & Plan:   Problem List Items Addressed This Visit      Cardiovascular and Mediastinum   HYPERTENSION, BENIGN ESSENTIAL    bp in fair control at this time  BP Readings from Last 1 Encounters:  07/08/16 118/82   No changes needed Disc lifstyle change with low sodium diet and exercise   Labs reviewed  Enc wt loss       Relevant Medications   lisinopril (PRINIVIL,ZESTRIL) 5 MG tablet     Endocrine   Hypothyroidism    Hypothyroidism  Pt has no clinical changes No change in energy level/ hair or skin/ edema and no tremor Lab Results  Component Value Date  TSH 1.13 06/30/2016          Relevant Medications   levothyroxine (SYNTHROID) 137 MCG tablet     Other   Encounter for routine gynecological examination    Routine exam No c/o or problems      Relevant Orders   Cytology - PAP   Obesity    Discussed how this problem influences overall health and the risks it imposes  Reviewed plan for weight loss with lower calorie diet (via better food choices and also portion control or program like weight watchers) and exercise building up to or more than 30 minutes 5 days per week including some aerobic activity   Commended on wt loss so far       PURE HYPERCHOLESTEROLEMIA     LDL is up  Disc goals for lipids and reasons to control them Rev labs with pt Rev low sat fat diet in detail Re check 3 mo after better diet /exercise  Handout given regarding this       Relevant Medications   lisinopril (PRINIVIL,ZESTRIL) 5 MG tablet   Routine general medical examination at a health care facility - Primary    Reviewed health habits including diet and exercise and skin cancer prevention Reviewed appropriate screening tests for age  Also reviewed health mt list, fam hx and immunization status , as well as social and family history   See HPI Labs reviewed Get your flu shot at work as planned  Call and schedule your mammogram at [facility]  Please call Dr Nigel Bridgeman office to see when next colonoscopy is due - ? What type of polyps were found in 2010   We need to re check cholesterol in 3 months (it is up a bit)  Avoid red meat/ fried foods/ egg yolks/ fatty breakfast meats/ butter, cheese and high fat dairy/ and shellfish        Other Visit Diagnoses   None.

## 2016-07-08 NOTE — Patient Instructions (Addendum)
Get your flu shot at work as planned  [facility]  Please call Dr Nigel Bridgemanh's office to see when next colonoscopy is due - ? What type of polyps were found in 2010   We need to re check cholesterol in 3 months (it is up a bit)  Avoid red meat/ fried foods/ egg yolks/ fatty breakfast meats/ butter, cheese and high fat dairy/ and shellfish

## 2016-07-08 NOTE — Progress Notes (Signed)
Pre visit review using our clinic review tool, if applicable. No additional management support is needed unless otherwise documented below in the visit note. 

## 2016-07-09 LAB — CYTOLOGY - PAP

## 2016-07-09 NOTE — Assessment & Plan Note (Signed)
Hypothyroidism  Pt has no clinical changes No change in energy level/ hair or skin/ edema and no tremor Lab Results  Component Value Date   TSH 1.13 06/30/2016

## 2016-07-09 NOTE — Assessment & Plan Note (Signed)
Routine exam No c/o or problems

## 2016-07-09 NOTE — Assessment & Plan Note (Signed)
LDL is up  Disc goals for lipids and reasons to control them Rev labs with pt Rev low sat fat diet in detail Re check 3 mo after better diet /exercise  Handout given regarding this

## 2016-07-09 NOTE — Assessment & Plan Note (Signed)
Reviewed health habits including diet and exercise and skin cancer prevention Reviewed appropriate screening tests for age  Also reviewed health mt list, fam hx and immunization status , as well as social and family history   See HPI Labs reviewed Get your flu shot at work as planned  Call and schedule your mammogram at [facility]  Please call Dr Nigel Bridgemanh's office to see when next colonoscopy is due - ? What type of polyps were found in 2010   We need to re check cholesterol in 3 months (it is up a bit)  Avoid red meat/ fried foods/ egg yolks/ fatty breakfast meats/ butter, cheese and high fat dairy/ and shellfish

## 2016-07-09 NOTE — Assessment & Plan Note (Signed)
Discussed how this problem influences overall health and the risks it imposes  Reviewed plan for weight loss with lower calorie diet (via better food choices and also portion control or program like weight watchers) and exercise building up to or more than 30 minutes 5 days per week including some aerobic activity   Commended on wt loss so far  

## 2016-07-09 NOTE — Assessment & Plan Note (Signed)
bp in fair control at this time  BP Readings from Last 1 Encounters:  07/08/16 118/82   No changes needed Disc lifstyle change with low sodium diet and exercise   Labs reviewed  Enc wt loss

## 2016-07-16 ENCOUNTER — Telehealth: Payer: Self-pay | Admitting: Family Medicine

## 2016-07-16 ENCOUNTER — Encounter: Payer: Self-pay | Admitting: Family Medicine

## 2016-07-16 NOTE — Telephone Encounter (Signed)
I reviewed pt's last colonoscopy with Dr Bluford Kaufmannh from 11/10- it looks like she had a hyperplastic polyp removed in fragments  On the report her recall was 11/15 Unsure if that still stands- would you please call their office and see if they still recommend a 5 year recall or did they extend it?     Thanks

## 2016-07-17 NOTE — Telephone Encounter (Addendum)
[facility]oke to [facility] and they said they did recommend her to have a repeat colonoscopy in 2015, they advise me they called her (no answer) so they sent her a few reminder letters and she never responded back to them but she was due in 2015  *I placed the report back in Dr. Royden Purlower's inbox incase she still needs it*

## 2016-07-17 NOTE — Telephone Encounter (Signed)
Please let pt know and I will do there ref if needed or she can just call and schedule it  thanks

## 2016-07-18 NOTE — Telephone Encounter (Signed)
Patient states that she filled out all of the paperwork for the repeat colonoscopy last year and then when she called them to schedule, they told her she didn't need it.  She is upset that they are telling us now that she does need it and is actually past due and doesn't understand the confusion.    I informed her of [facility] and suggested that she call them back and discuss this with them.  In addition, I did offer to call and assist with this.  She is very frustrated with the confusion and will call them first and will let us know if she needs us to get involved or make the referral.

## 2016-07-18 NOTE — Telephone Encounter (Signed)
Thanks- I don't blame her for being frustrated- we are getting 2 different responses from that office  Let me know if I can be of assistance and If I am wrong please let me know

## 2016-07-21 ENCOUNTER — Other Ambulatory Visit: Payer: Self-pay | Admitting: Family Medicine

## 2016-07-21 DIAGNOSIS — Z1231 Encounter for screening mammogram for malignant neoplasm of breast: Secondary | ICD-10-CM

## 2016-08-26 ENCOUNTER — Ambulatory Visit
Admission: RE | Admit: 2016-08-26 | Discharge: 2016-08-26 | Disposition: A | Discharge status: Home / Self Care | Payer: Managed Care, Other (non HMO) | Source: Ambulatory Visit | Attending: Family Medicine | Admitting: Family Medicine

## 2016-08-26 DIAGNOSIS — Z1231 Encounter for screening mammogram for malignant neoplasm of breast: Secondary | ICD-10-CM | POA: Insufficient documentation

## 2016-08-27 ENCOUNTER — Other Ambulatory Visit: Payer: Self-pay | Admitting: Family Medicine

## 2016-08-27 DIAGNOSIS — N6489 Other specified disorders of breast: Secondary | ICD-10-CM

## 2016-09-11 ENCOUNTER — Ambulatory Visit
Admission: RE | Admit: 2016-09-11 | Discharge: 2016-09-11 | Disposition: A | Discharge status: Home / Self Care | Payer: Managed Care, Other (non HMO) | Source: Ambulatory Visit | Attending: Family Medicine | Admitting: Family Medicine

## 2016-09-11 DIAGNOSIS — N6489 Other specified disorders of breast: Secondary | ICD-10-CM

## 2016-10-08 ENCOUNTER — Other Ambulatory Visit (INDEPENDENT_AMBULATORY_CARE_PROVIDER_SITE_OTHER): Payer: Managed Care, Other (non HMO)

## 2016-10-08 DIAGNOSIS — E7800 Pure hypercholesterolemia, unspecified: Secondary | ICD-10-CM | POA: Diagnosis not present

## 2016-10-08 LAB — LIPID PANEL
CHOL/HDL RATIO: 4
Cholesterol: 222 mg/dL — ABNORMAL HIGH (ref 0–200)
HDL: 58.9 mg/dL (ref >39.00)
LDL Cholesterol: 145 mg/dL — ABNORMAL HIGH (ref 0–99)
NONHDL: 163.21
Triglycerides: 89 mg/dL (ref 0.0–149.0)
VLDL: 17.8 mg/dL (ref 0.0–40.0)

## 2017-01-15 ENCOUNTER — Ambulatory Visit
Admission: RE | Admit: 2017-01-15 | Payer: Managed Care, Other (non HMO) | Source: Ambulatory Visit | Admitting: Gastroenterology

## 2017-01-15 ENCOUNTER — Encounter: Admission: RE | Payer: Self-pay | Source: Ambulatory Visit

## 2017-01-15 SURGERY — COLONOSCOPY WITH PROPOFOL
Anesthesia: General

## 2017-02-10 ENCOUNTER — Encounter: Payer: Self-pay | Admitting: Family Medicine

## 2017-07-13 ENCOUNTER — Other Ambulatory Visit: Payer: Self-pay | Admitting: Family Medicine

## 2017-07-22 ENCOUNTER — Other Ambulatory Visit: Payer: Self-pay | Admitting: Family Medicine

## 2017-07-24 MED ORDER — SYNTHROID 137 MCG PO TABS: 137.0000 ug | ORAL_TABLET | Freq: Every day | ORAL | 0 refills | Status: DC

## 2017-07-24 MED ORDER — LISINOPRIL 5 MG PO TABS: 5.0000 mg | ORAL_TABLET | Freq: Every day | ORAL | 2 refills | Status: DC

## 2017-07-24 MED ORDER — SYNTHROID 137 MCG PO TABS: 137.0000 ug | ORAL_TABLET | Freq: Every day | ORAL | 2 refills | Status: DC

## 2017-08-04 ENCOUNTER — Other Ambulatory Visit: Payer: Self-pay | Admitting: Family Medicine

## 2017-08-04 IMAGING — MG MM DIGITAL DIAGNOSTIC UNILAT*L* W/ TOMO W/ CAD
6 of 9 series · 6 of 21 positions shown · non-contrast
Comparison: Previous exam(s).

CLINICAL DATA: Callback from screening mammogram for possible left
breast asymmetry

EXAM:
2D DIGITAL DIAGNOSTIC LEFT MAMMOGRAM WITH CAD AND ADJUNCT TOMO
ULTRASOUND LEFT BREAST

[L CC (1 of 2)]
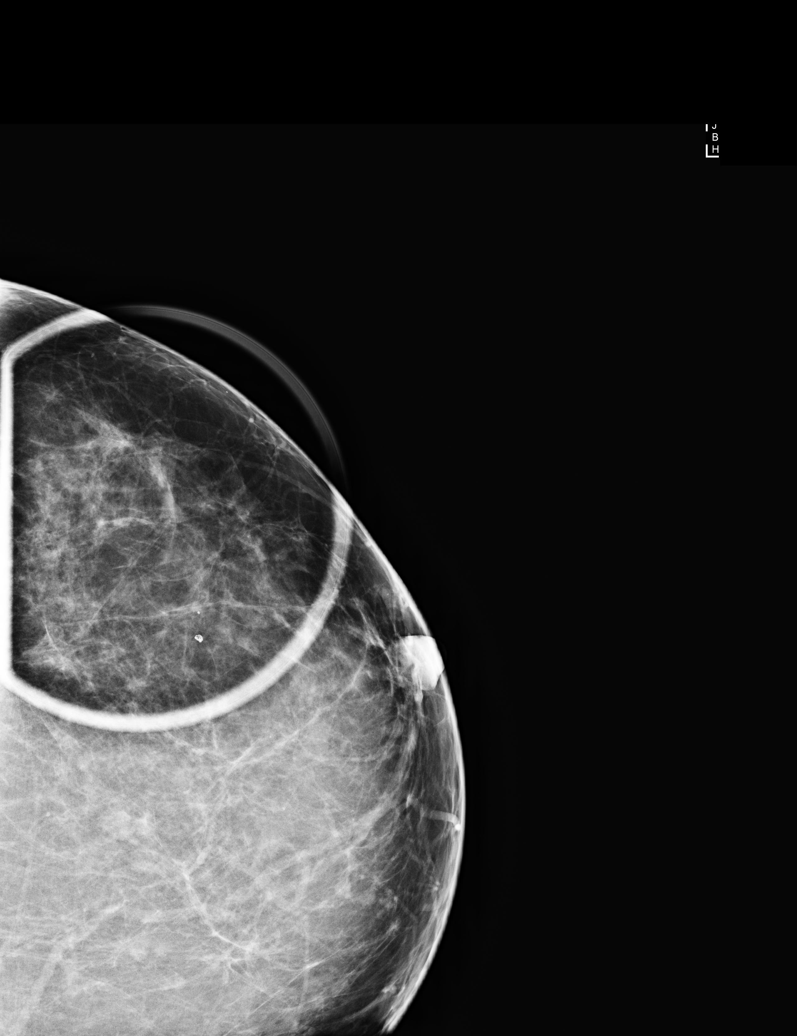

[L CC (2 of 2)]
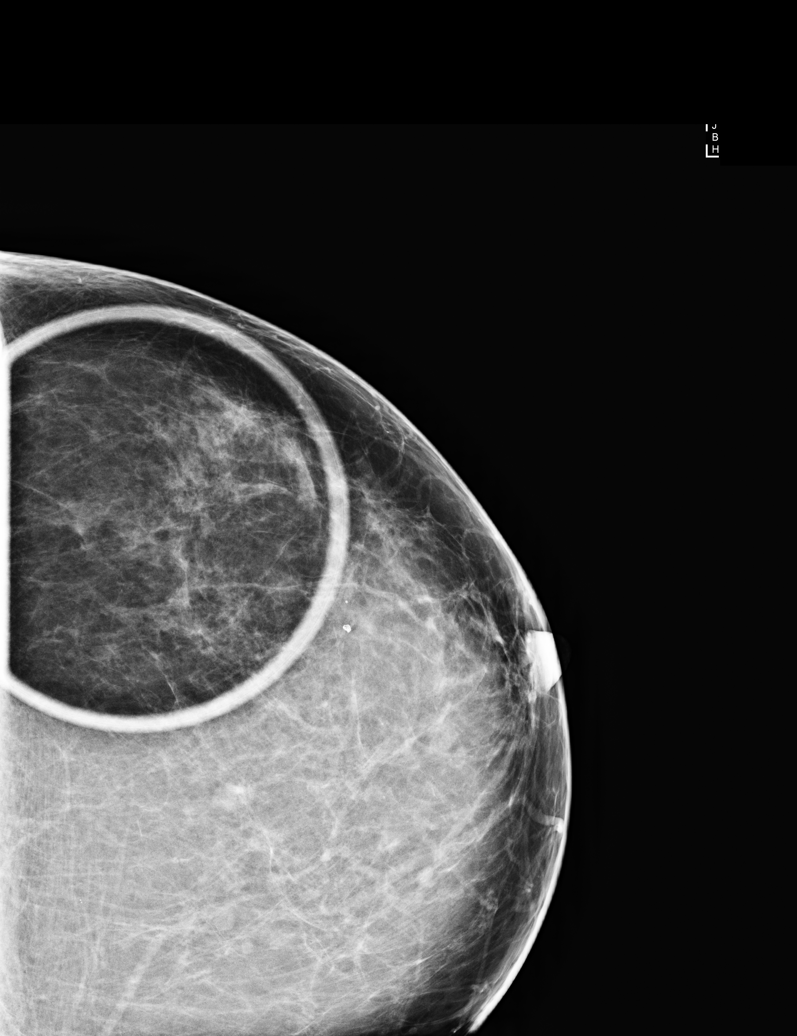

[L CC synth-2D (1 of 2)]
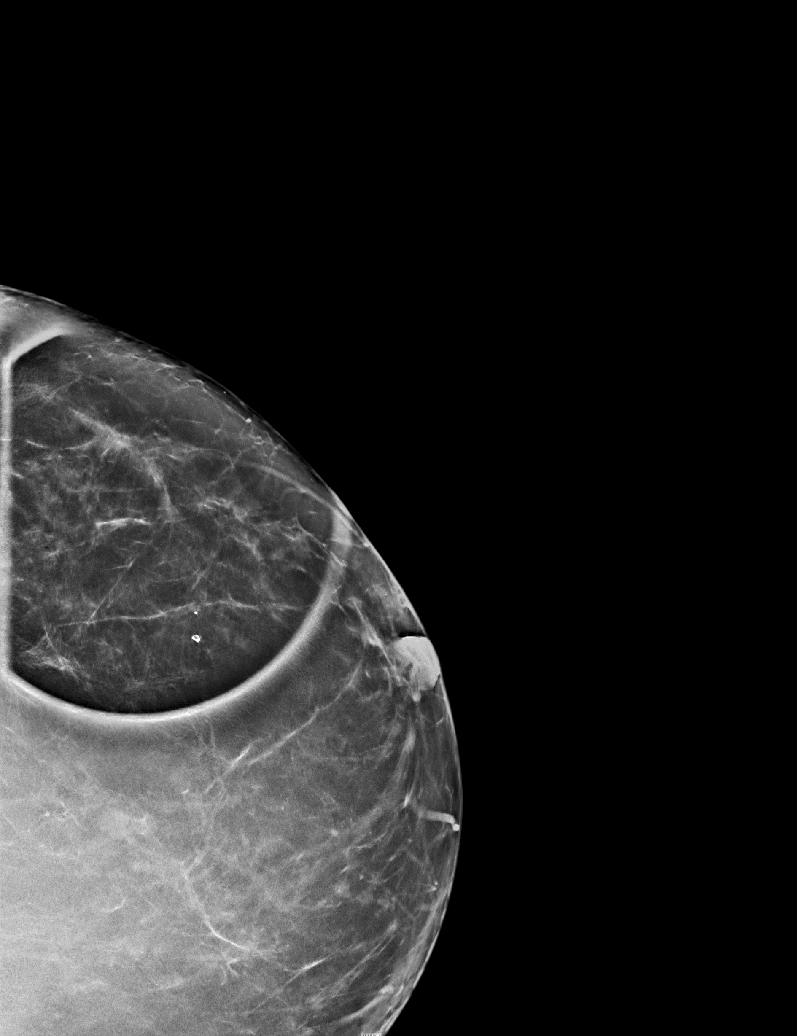

[L MLO synth-2D]
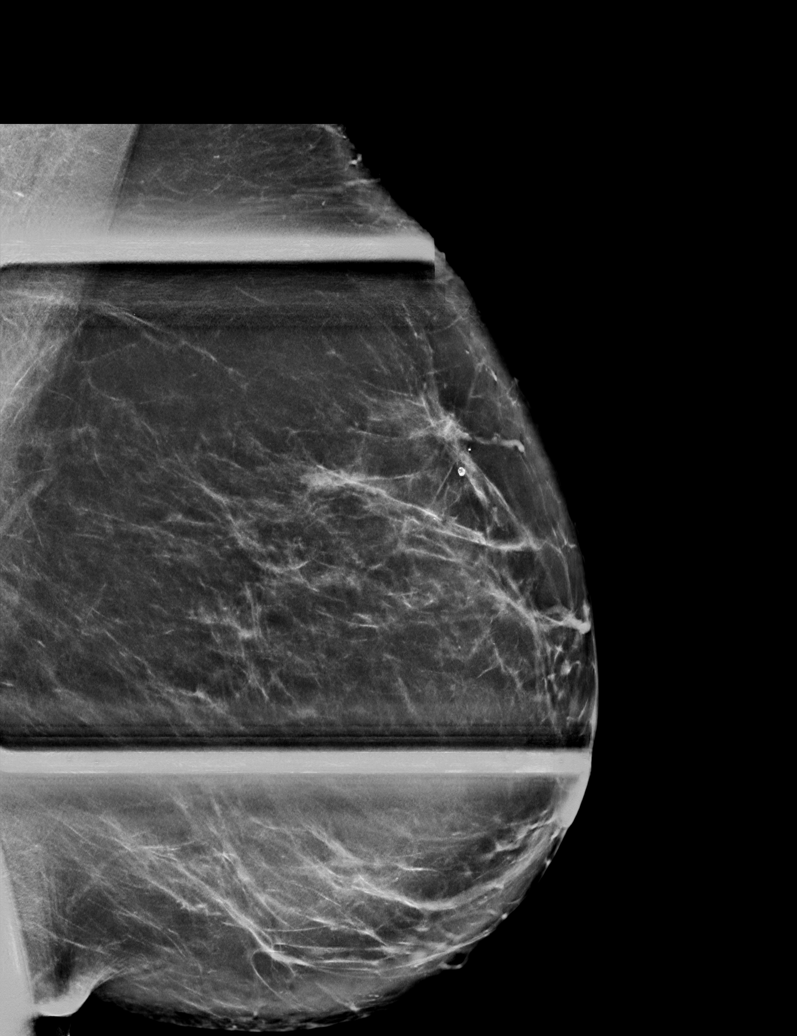

[L MLO]
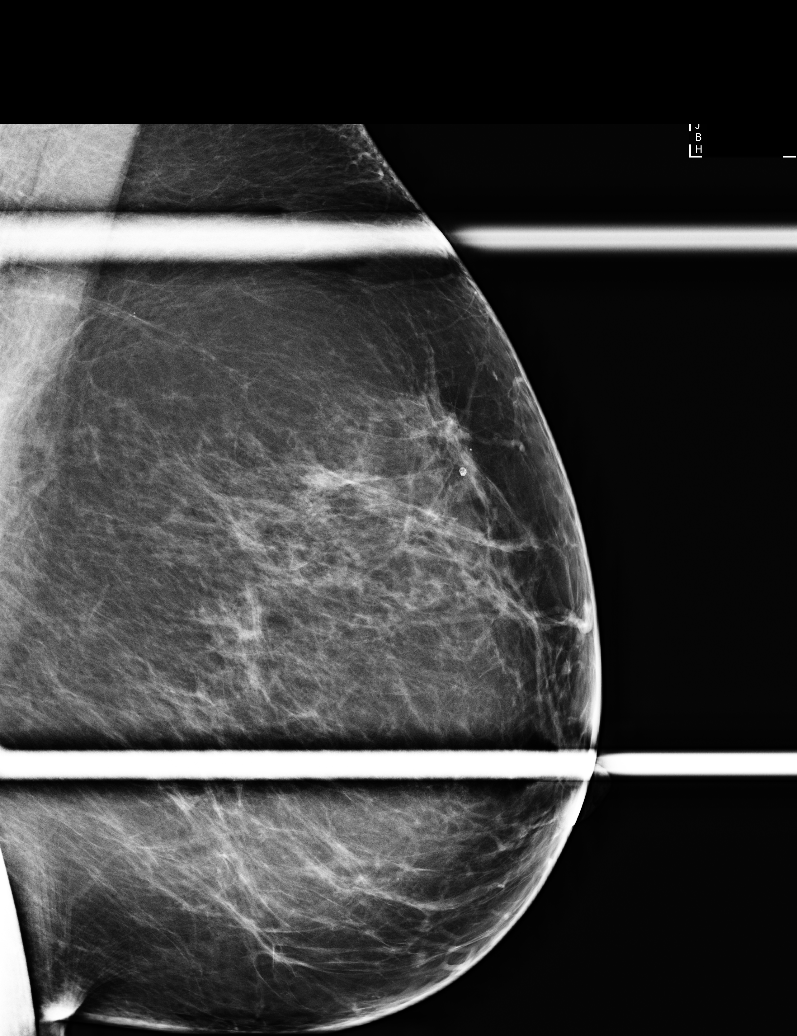

[L CC synth-2D (2 of 2)]
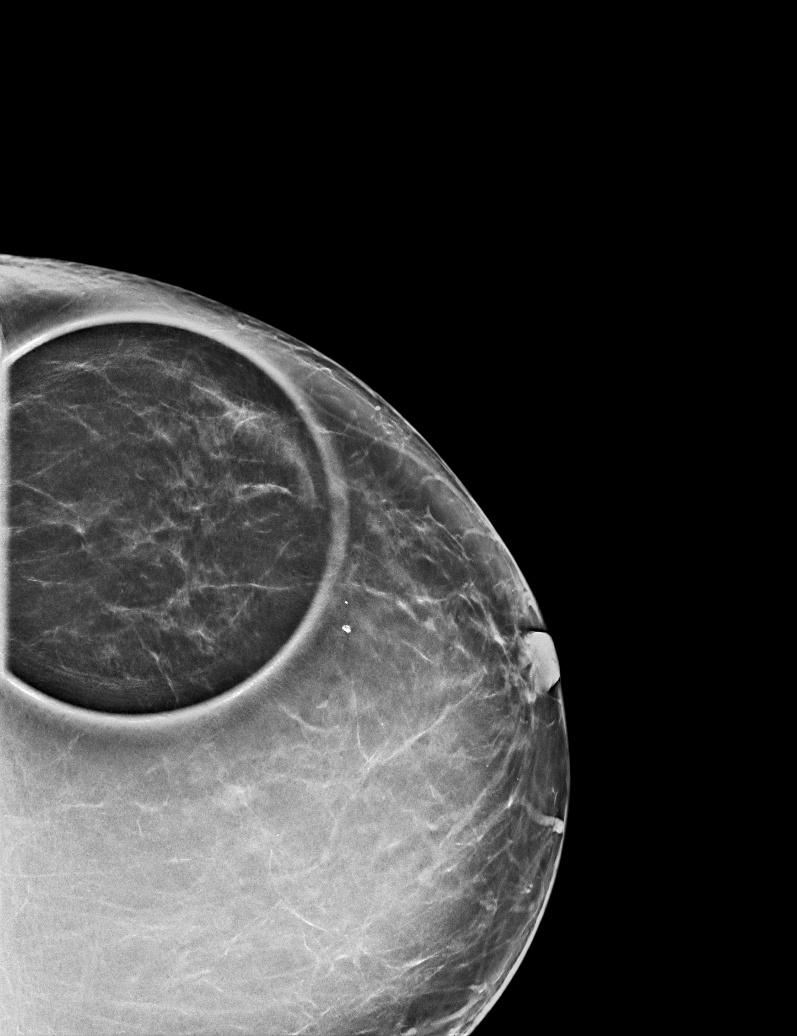

[6 of 21 positions shown; findings below may reference images not displayed]

ACR Breast Density Category b: There are scattered areas of
fibroglandular density.
FINDINGS: Cc and MLO spot-compression views of the left breast were performed
with tomosynthesis. On the additional views, there is no suspicious
mass or other abnormality in the area of concern identified on
screening mammogram. The breast parenchymal pattern in this area is
unchanged from prior mammograms.

Mammographic images were processed with CAD.

On physical exam, no discrete mass felt in the area of concern
within the upper, outer left breast.

Targeted ultrasound of the upper, outer left breast was performed
demonstrating no suspicious cystic or solid sonographic finding in
the area of concern.
IMPRESSION: No mammographic or sonographic evidence of malignancy.

RECOMMENDATION:
Screening mammogram in one year.(Code:DE-C-MM3)

I have discussed the findings and recommendations with the patient.
Results were also provided in writing at the conclusion of the
visit. If applicable, a reminder letter will be sent to the patient
regarding the next appointment.

BI-RADS CATEGORY  1: Negative.

## 2017-09-14 ENCOUNTER — Encounter: Payer: Self-pay | Admitting: Family Medicine

## 2017-09-14 ENCOUNTER — Ambulatory Visit (INDEPENDENT_AMBULATORY_CARE_PROVIDER_SITE_OTHER): Payer: Managed Care, Other (non HMO) | Admitting: Family Medicine

## 2017-09-14 VITALS — BP 128/82 | HR 58 | Temp 98.0°F | Ht 63.75 in | Wt 184.2 lb

## 2017-09-14 DIAGNOSIS — E7800 Pure hypercholesterolemia, unspecified: Secondary | ICD-10-CM | POA: Diagnosis not present

## 2017-09-14 DIAGNOSIS — Z6831 Body mass index (BMI) 31.0-31.9, adult: Secondary | ICD-10-CM

## 2017-09-14 DIAGNOSIS — Z23 Encounter for immunization: Secondary | ICD-10-CM | POA: Diagnosis not present

## 2017-09-14 DIAGNOSIS — I10 Essential (primary) hypertension: Secondary | ICD-10-CM

## 2017-09-14 DIAGNOSIS — Z0000 Encounter for general adult medical examination without abnormal findings: Secondary | ICD-10-CM | POA: Diagnosis not present

## 2017-09-14 DIAGNOSIS — E890 Postprocedural hypothyroidism: Secondary | ICD-10-CM | POA: Diagnosis not present

## 2017-09-14 DIAGNOSIS — E6609 Other obesity due to excess calories: Secondary | ICD-10-CM | POA: Diagnosis not present

## 2017-09-14 DIAGNOSIS — E2839 Other primary ovarian failure: Secondary | ICD-10-CM

## 2017-09-14 DIAGNOSIS — Z1211 Encounter for screening for malignant neoplasm of colon: Secondary | ICD-10-CM

## 2017-09-14 LAB — CBC WITH DIFFERENTIAL/PLATELET
BASOS PCT: 0.4 % (ref 0.0–3.0)
Basophils Absolute: 0 10*3/uL (ref 0.0–0.1)
Eosinophils Absolute: 0.4 10*3/uL (ref 0.0–0.7)
Eosinophils Relative: 4.5 % (ref 0.0–5.0)
HEMATOCRIT: 36.3 % (ref 36.0–46.0)
HEMOGLOBIN: 12 g/dL (ref 12.0–15.0)
Lymphocytes Relative: 32.5 % (ref 12.0–46.0)
Lymphs Abs: 2.6 10*3/uL (ref 0.7–4.0)
MCHC: 33 g/dL (ref 30.0–36.0)
MCV: 88.7 fl (ref 78.0–100.0)
MONO ABS: 0.5 10*3/uL (ref 0.1–1.0)
Monocytes Relative: 6.8 % (ref 3.0–12.0)
NEUTROS ABS: 4.4 10*3/uL (ref 1.4–7.7)
Neutrophils Relative %: 55.8 % (ref 43.0–77.0)
Platelets: 318 10*3/uL (ref 150.0–400.0)
RBC: 4.09 Mil/uL (ref 3.87–5.11)
RDW: 14.3 % (ref 11.5–15.5)
WBC: 7.9 10*3/uL (ref 4.0–10.5)

## 2017-09-14 LAB — TSH: TSH: 0.8 u[IU]/mL (ref 0.35–4.50)

## 2017-09-14 MED ORDER — LISINOPRIL 5 MG PO TABS: 5.0000 mg | ORAL_TABLET | Freq: Every day | ORAL | 11 refills | Status: DC

## 2017-09-14 MED ORDER — SYNTHROID 137 MCG PO TABS: 137.0000 ug | ORAL_TABLET | Freq: Every day | ORAL | 11 refills | Status: DC

## 2017-09-14 NOTE — Assessment & Plan Note (Addendum)
Colonoscopy 5/18 with adenoma-rec 3 y recall Dr Mechele CollinElliott

## 2017-09-14 NOTE — Assessment & Plan Note (Signed)
Reviewed health habits including diet and exercise and skin cancer prevention Reviewed appropriate screening tests for age  Also reviewed health mt list, fam hx and immunization status , as well as social and family history   See HPI Labs reviewed  prevnar and flu vaccines today  Labs for wellness today Pt will schedule her own mammogram  Ref made for first dexa (disc imp of ca and D)  Wt loss enc

## 2017-09-14 NOTE — Progress Notes (Signed)
Subjective:    Patient ID: Jodi Steele, female    DOB: August 28, 1951, 66 y.o.   MRN: [id number]  HPI  Here for health maintenance exam and to review chronic medical problems   She is not using medicare/ still working   [place] [employer]    Wt Readings from Last 3 Encounters:  09/14/17 184 lb 4 oz (83.6 kg)  07/08/16 187 lb 12 oz (85.2 kg)  07/27/15 193 lb (87.5 kg)  has lost some wt despite travel  A fair amt of exercise  31.87 kg/m  dexa - is interested in screening  [place] is easier  Getting regular exercise -walking  One fall - (did a 5K at thanksgiving- dog pulled her over into the grass) No fractures  Takes ca and D  Due for prevnar vaccine since she is 66- will get today   Flu shot - wants one today   Mammogram 11/17-addnl views were normal  (she will schedule)  Self breast exam -no lumps   Pap 9/17  Nl with neg HPV reflex No gyn problems   Tetanus shot 10/13  Colonoscopy 5/18 with Dr Markham JordanElliot  Large polyps were found  6 mo f/u flex sig was planned -- but then the path report states 3 y colonoscopy    Due for labs today  Hypothyroidism  Pt has no clinical changes No change in energy level/ hair or skin/ edema and no tremor Lab Results  Component Value Date   TSH 1.13 06/30/2016    Thyroidectomy in the past  Due for tsh   bp is stable today  No cp or palpitations or headaches or edema  No side effects to medicines  BP Readings from Last 3 Encounters:  09/14/17 128/82  07/08/16 118/82  07/27/15 130/84     Hyperlipidemia Lab Results  Component Value Date   CHOL 222 (H) 10/08/2016   HDL 58.90 10/08/2016   LDLCALC 145 (H) 10/08/2016   LDLDIRECT 144.4 12/06/2013   TRIG 89.0 10/08/2016   CHOLHDL 4 10/08/2016  watching diet- had brought it down from 160s to 140s last time (LDL) Eats some eggs Mostly vegetables in general  Red meat- a bit more on the cruise   Gave blood before thanksgiving- double   Patient Active  Problem List   Diagnosis Date Noted  . Estrogen deficiency 09/14/2017  . Skin lesion of face 07/29/2015  . Colon cancer screening 06/27/2015  . Encounter for routine gynecological examination 12/10/2012  . Obesity 11/28/2011  . Other screening mammogram 08/22/2011  . Routine general medical examination at a health care facility 08/14/2011  . Hypothyroidism 09/04/2008  . PURE HYPERCHOLESTEROLEMIA 09/04/2008  . HYPERTENSION, BENIGN ESSENTIAL 04/20/2007  . DEPRESSION 04/07/2007  . ASTHMA 04/07/2007  . FIBROCYSTIC BREAST DISEASE 04/07/2007  . BUNION 04/07/2007   Past Medical History:  Diagnosis Date  . Asthma    related to pet/dog exposure  . Bunion   . Colon polyp   . Depression   . Diffuse cystic mastopathy   . Goiter, unspecified    surgery  . HTN (hypertension)   . Hypothyroidism   . Pure hypercholesterolemia    Past Surgical History:  Procedure Laterality Date  . BREAST BIOPSY Right    1985 abcsess  . BREAST CYST ASPIRATION Left    negative  . COLONOSCOPY  11/10   polyp  . INCISION AND DRAINAGE BREAST ABSCESS     right  . THYROIDECTOMY    . TUBAL LIGATION  Social History   Tobacco Use  . Smoking status: Former Smoker    Last attempt to quit: 10/13/1988    Years since quitting: 28.9  . Smokeless tobacco: Never Used  Substance Use Topics  . Alcohol use: Yes    Alcohol/week: 0.0 oz    Comment: daily usually wine  . Drug use: No   Family History  Problem Relation Age of Onset  . Other Father        Mesothelioma  . Thyroid cancer Mother        Anaplastic thyroid cancer  . Hypertension Mother   . Diabetes Mother   . Thyroid cancer Sister   . Diabetes Unknown        GF  . Breast cancer Neg Hx    No Known Allergies Current Outpatient Medications on File Prior to Visit  Medication Sig Dispense Refill  . Calcium Carbonate (CALCIUM 600 PO) Take 2 capsules by mouth daily.      . Ginkgo Biloba 40 MG TABS Take 1 tablet by mouth daily.      . VENTOLIN HFA  108 (90 Base) MCG/ACT inhaler inhale 2 puffs by mouth INTO THE LUNGS every 4 hours if needed for wheezing 18 g 1  . vitamin C (ASCORBIC ACID) 500 MG tablet Take 500 mg by mouth daily.      No current facility-administered medications on file prior to visit.     Review of Systems  Constitutional: Negative for activity change, appetite change, fatigue, fever and unexpected weight change.  HENT: Negative for congestion, rhinorrhea, sore throat and trouble swallowing.   Eyes: Negative for pain, redness, itching and visual disturbance.  Respiratory: Negative for cough, chest tightness, shortness of breath and wheezing.   Cardiovascular: Negative for chest pain and palpitations.  Gastrointestinal: Negative for abdominal pain, blood in stool, constipation, diarrhea and nausea.  Endocrine: Negative for cold intolerance, heat intolerance, polydipsia and polyuria.  Genitourinary: Negative for difficulty urinating, dysuria, frequency and urgency.  Musculoskeletal: Negative for arthralgias, joint swelling and myalgias.  Skin: Negative for pallor and rash.  Neurological: Negative for dizziness, tremors, weakness, numbness and headaches.  Hematological: Negative for adenopathy. Does not bruise/bleed easily.  Psychiatric/Behavioral: Negative for decreased concentration and dysphoric mood. The patient is not nervous/anxious.        Objective:   Physical Exam  Constitutional: She appears well-developed and well-nourished. No distress.  overwt and well appearing   HENT:  Head: Normocephalic and atraumatic.  Right Ear: External ear normal.  Left Ear: External ear normal.  Mouth/Throat: Oropharynx is clear and moist.  Eyes: Conjunctivae and EOM are normal. Pupils are equal, round, and reactive to light. No scleral icterus.  Neck: Normal range of motion. Neck supple. No JVD present. Carotid bruit is not present. No thyromegaly present.  Cardiovascular: Normal rate, regular rhythm, normal heart sounds and  intact distal pulses. Exam reveals no gallop.  Pulmonary/Chest: Effort normal and breath sounds normal. No respiratory distress. She has no wheezes. She exhibits no tenderness.  Abdominal: Soft. Bowel sounds are normal. She exhibits no distension, no abdominal bruit and no mass. There is no tenderness.  Genitourinary: No breast swelling, tenderness, discharge or bleeding.  Genitourinary Comments: Breast exam: No mass, nodules, thickening, tenderness, bulging, retraction, inflamation, nipple discharge or skin changes noted.  No axillary or clavicular LA.      Musculoskeletal: Normal range of motion. She exhibits no edema or tenderness.  Medial bunions bilaterally  Lymphadenopathy:    She has no cervical adenopathy.  Neurological: She is alert. She has normal reflexes. No cranial nerve deficit. She exhibits normal muscle tone. Coordination normal.  Skin: Skin is warm and dry. No rash noted. No erythema. No pallor.  Thickened R 2nd toe nail  Solar lentigines diffusely   Psychiatric: She has a normal mood and affect.          Assessment & Plan:   Problem List Items Addressed This Visit      Cardiovascular and Mediastinum   HYPERTENSION, BENIGN ESSENTIAL    bp in fair control at this time  BP Readings from Last 1 Encounters:  09/14/17 128/82   No changes needed Disc lifstyle change with low sodium diet and exercise  Labs today Wt loss enc      Relevant Medications   lisinopril (PRINIVIL,ZESTRIL) 5 MG tablet   Other Relevant Orders   CBC with Differential/Platelet   Comprehensive metabolic panel   Lipid panel   TSH     Endocrine   Hypothyroidism    TSH today  No clinical changes       Relevant Medications   SYNTHROID 137 MCG tablet   Other Relevant Orders   TSH     Other   Colon cancer screening    Colonoscopy 5/18 with adenoma-rec 3 y recall Dr Mechele Collin       Estrogen deficiency    Refer for bone density screen for OP  Disc imp of ca /D and exercise        Relevant Orders   DG Bone Density   Obesity    Discussed how this problem influences overall health and the risks it imposes  Reviewed plan for weight loss with lower calorie diet (via better food choices and also portion control or program like weight watchers) and exercise building up to or more than 30 minutes 5 days per week including some aerobic activity         PURE HYPERCHOLESTEROLEMIA    Disc goals for lipids and reasons to control them Rev labs with pt  (last check imp with diet) Rev low sat fat diet in detail  Lipid panel today      Relevant Medications   lisinopril (PRINIVIL,ZESTRIL) 5 MG tablet   Other Relevant Orders   Lipid panel   Routine general medical examination at a health care facility - Primary    Reviewed health habits including diet and exercise and skin cancer prevention Reviewed appropriate screening tests for age  Also reviewed health mt list, fam hx and immunization status , as well as social and family history   See HPI Labs reviewed  prevnar and flu vaccines today  Labs for wellness today Pt will schedule her own mammogram  Ref made for first dexa (disc imp of ca and D)  Wt loss enc         Other Visit Diagnoses    Need for influenza vaccination       Relevant Orders   Flu Vaccine QUAD 6+ mos PF IM (Fluarix Quad PF) (Completed)   Need for vaccination with 13-polyvalent pneumococcal conjugate vaccine       Relevant Orders   Pneumococcal conjugate vaccine 13-valent (Completed)

## 2017-09-14 NOTE — Patient Instructions (Addendum)
Don't forget to schedule your annual mammogram   We will refer you for a bone density test   Take care of yourself   Lab today  prevnar vaccine today  Flu shot today

## 2017-09-14 NOTE — Assessment & Plan Note (Signed)
Refer for bone density screen for OP  Disc imp of ca /D and exercise

## 2017-09-14 NOTE — Assessment & Plan Note (Signed)
bp in fair control at this time  BP Readings from Last 1 Encounters:  09/14/17 128/82   No changes needed Disc lifstyle change with low sodium diet and exercise  Labs today Wt loss enc

## 2017-09-14 NOTE — Assessment & Plan Note (Signed)
TSH today  No clinical changes  

## 2017-09-14 NOTE — Assessment & Plan Note (Signed)
Discussed how this problem influences overall health and the risks it imposes  Reviewed plan for weight loss with lower calorie diet (via better food choices and also portion control or program like weight watchers) and exercise building up to or more than 30 minutes 5 days per week including some aerobic activity    

## 2017-09-14 NOTE — Assessment & Plan Note (Signed)
Disc goals for lipids and reasons to control them Rev labs with pt  (last check imp with diet) Rev low sat fat diet in detail  Lipid panel today

## 2017-09-15 LAB — COMPREHENSIVE METABOLIC PANEL
ALK PHOS: 86 U/L (ref 39–117)
ALT: 10 U/L (ref 0–35)
AST: 15 U/L (ref 0–37)
Albumin: 4.3 g/dL (ref 3.5–5.2)
BILIRUBIN TOTAL: 0.4 mg/dL (ref 0.2–1.2)
BUN: 13 mg/dL (ref 6–23)
CO2: 26 mEq/L (ref 19–32)
Calcium: 9.5 mg/dL (ref 8.4–10.5)
Chloride: 104 mEq/L (ref 96–112)
Creatinine, Ser: 0.7 mg/dL (ref 0.40–1.20)
GFR: 89.07 mL/min (ref >60.00)
GLUCOSE: 80 mg/dL (ref 70–99)
Potassium: 3.9 mEq/L (ref 3.5–5.1)
SODIUM: 140 meq/L (ref 135–145)
TOTAL PROTEIN: 7.2 g/dL (ref 6.0–8.3)

## 2017-09-15 LAB — LIPID PANEL
CHOL/HDL RATIO: 4
CHOLESTEROL: 227 mg/dL — AB (ref 0–200)
HDL: 56.1 mg/dL (ref >39.00)
LDL CALC: 150 mg/dL — AB (ref 0–99)
NONHDL: 170.52
TRIGLYCERIDES: 105 mg/dL (ref 0.0–149.0)
VLDL: 21 mg/dL (ref 0.0–40.0)

## 2017-09-24 ENCOUNTER — Other Ambulatory Visit: Payer: Self-pay | Admitting: Family Medicine

## 2017-09-24 DIAGNOSIS — Z1231 Encounter for screening mammogram for malignant neoplasm of breast: Secondary | ICD-10-CM

## 2017-10-11 ENCOUNTER — Other Ambulatory Visit: Payer: Self-pay | Admitting: Family Medicine

## 2017-10-21 ENCOUNTER — Ambulatory Visit
Admission: RE | Admit: 2017-10-21 | Discharge: 2017-10-21 | Disposition: A | Discharge status: Home / Self Care | Payer: Managed Care, Other (non HMO) | Source: Ambulatory Visit | Attending: Family Medicine | Admitting: Family Medicine

## 2017-10-21 DIAGNOSIS — Z1231 Encounter for screening mammogram for malignant neoplasm of breast: Secondary | ICD-10-CM

## 2017-12-16 ENCOUNTER — Encounter: Payer: Self-pay | Admitting: Family Medicine

## 2017-12-16 ENCOUNTER — Telehealth: Payer: Self-pay | Admitting: Family Medicine

## 2017-12-16 NOTE — Telephone Encounter (Signed)
Yes- if she is not responding Thanks

## 2017-12-16 NOTE — Telephone Encounter (Signed)
FYI I have left several message asking pt to call office 2/6 2/18 3/6 and mailed letter today, trying to schedule pt bone density appointment. Is it ok to cancel?

## 2018-01-18 ENCOUNTER — Ambulatory Visit
Admission: RE | Admit: 2018-01-18 | Discharge: 2018-01-18 | Disposition: A | Discharge status: Home / Self Care | Payer: Managed Care, Other (non HMO) | Source: Ambulatory Visit | Attending: Family Medicine | Admitting: Family Medicine

## 2018-01-18 ENCOUNTER — Encounter: Payer: Self-pay | Admitting: Family Medicine

## 2018-01-18 DIAGNOSIS — M85852 Other specified disorders of bone density and structure, left thigh: Secondary | ICD-10-CM | POA: Insufficient documentation

## 2018-01-18 DIAGNOSIS — R29890 Loss of height: Secondary | ICD-10-CM | POA: Insufficient documentation

## 2018-01-18 DIAGNOSIS — Z13820 Encounter for screening for osteoporosis: Secondary | ICD-10-CM | POA: Diagnosis not present

## 2018-01-18 DIAGNOSIS — E2839 Other primary ovarian failure: Secondary | ICD-10-CM | POA: Diagnosis not present

## 2018-01-18 DIAGNOSIS — M8580 Other specified disorders of bone density and structure, unspecified site: Secondary | ICD-10-CM | POA: Insufficient documentation

## 2018-04-05 ENCOUNTER — Ambulatory Visit: Payer: Self-pay | Admitting: Family Medicine

## 2018-04-05 VITALS — BP 145/90 | HR 69 | Resp 16 | Ht 64.0 in | Wt 182.0 lb

## 2018-04-05 DIAGNOSIS — Z0189 Encounter for other specified special examinations: Principal | ICD-10-CM

## 2018-04-05 DIAGNOSIS — R062 Wheezing: Secondary | ICD-10-CM

## 2018-04-05 DIAGNOSIS — Z008 Encounter for other general examination: Secondary | ICD-10-CM

## 2018-04-05 MED ORDER — PREDNISONE 20 MG PO TABS: 40.0000 mg | ORAL_TABLET | Freq: Every day | ORAL | 0 refills | Status: AC

## 2018-04-05 NOTE — Progress Notes (Signed)
  Subjective: Annual biometrics screening  Patient presents for her annual biometric screening. Patient reports eating a healthy, well-rounded diet and getting regular physical activity.  Patient regularly sees her primary care provider.  Patient reports a history of wheezing when exposed to allergens in the past and reports wheezing and nonproductive cough for the last 2 weeks since she [facility].  Patient reports her symptoms have been gradually improving.  Patient denies any other symptoms.  Treatment to date: Using albuterol inhaler every 4-6 hours at night.  Reports rare use of this outside of allergen exposure. Denies rash, nausea, vomiting, diarrhea, SOB, chest or back pain, ear pain, sore throat, difficulty swallowing, confusion, nasal congestion, purulent nasal discharge, anosmia/hyposmia, dental pain, facial pressure/unilateral facial pain, pain exacerbated by bending over, headache, body aches, fatigue, fever, chills, severe symptoms, or initial improvement and then worsening of symptoms. Patient denies any other issues or concerns.   Review of Systems Pertinent items noted in HPI and remainder of comprehensive ROS otherwise negative.     Objective:   Physical Exam General: Awake, alert, and oriented. No acute distress. Well developed, hydrated and nourished. Appears stated age. Nontoxic appearance.  HEENT: No PND noted.  No erythema to posterior oropharynx.  No edema or exudates of pharynx or tonsils. No erythema or bulging of TM.  No erythema/edema to nasal mucosa. Sinuses nontender. Supple neck without adenopathy. Cardiac: Heart rate and rhythm are normal. No murmurs, gallops, or rubs are auscultated. S1 and S2 are heard and are of normal intensity.  Respiratory: No signs of respiratory distress.  Generalized, bilateral expiratory wheeze. No tachypnea. Able to speak in full sentences without dyspnea. Nonlabored respirations.  Skin: Skin is warm, dry and  intact. Appropriate color for ethnicity. No cyanosis noted.  Neurological: The patient is awake, alert and oriented to person, place, and time with normal speech.  Memory is normal and thought processes intact. No gait abnormalities are appreciated.  Psychiatric: Appropriate mood and affect.   Assessment Annual biometrics screening  Asthma   Plan  Lipid panel and fasting blood sugar pending. Encouraged routine visits with primary care provider.  Patient's blood pressure is 145/90 today.  Discussed normal values.  Advised patient to monitor this regularly and report abnormal values to her primary care provider. Encouraged patient to get regular exercise and eat a healthy, well-rounded diet. Discussed use of albuterol, recommended this as needed, discussed side/adverse effects. Prescribed a 5-day course of prednisone and discussed side/adverse effects.  Discussed the importance of avoiding unnecessary antibiotic therapy.   Advised patient follow-up with her primary care provider. Discussed red flag symptoms and circumstances with which to seek medical care.   New Prescriptions   PREDNISONE (DELTASONE) 20 MG TABLET    Take 2 tablets (40 mg total) by mouth daily with breakfast for 5 days.

## 2018-04-06 LAB — GLUCOSE, RANDOM: Glucose: 99 mg/dL (ref 65–99)

## 2018-04-06 LAB — LIPID PANEL
Chol/HDL Ratio: 3.9 ratio (ref 0.0–4.4)
Cholesterol, Total: 225 mg/dL — ABNORMAL HIGH (ref 100–199)
HDL: 58 mg/dL (ref >39)
LDL Calculated: 145 mg/dL — ABNORMAL HIGH (ref 0–99)
Triglycerides: 108 mg/dL (ref 0–149)
VLDL CHOLESTEROL CAL: 22 mg/dL (ref 5–40)

## 2018-04-06 NOTE — Progress Notes (Signed)
Dear Jodi Steele, I wanted to let you know that your lipid panel and fasting blood sugar came back.  Everything is normal, with the exception of your total cholesterol and LDL cholesterol.  Your total cholesterol is elevated at 225, normal values are between 100 and 199. Your LDL cholesterol ("bad cholesterol") is elevated at 145, normal values are below 99.  This has improved slightly from 6 months ago, when your LDL cholesterol was 150.  These abnormal values increase your risk for cardiovascular disease.  I want you to follow-up with your primary care provider regarding these results.

## 2018-04-22 ENCOUNTER — Encounter: Payer: Self-pay | Admitting: Family Medicine

## 2018-04-22 ENCOUNTER — Ambulatory Visit: Payer: Managed Care, Other (non HMO) | Admitting: Family Medicine

## 2018-04-22 ENCOUNTER — Telehealth: Payer: Self-pay

## 2018-04-22 DIAGNOSIS — J209 Acute bronchitis, unspecified: Secondary | ICD-10-CM

## 2018-04-22 MED ORDER — AZITHROMYCIN 250 MG PO TABS
ORAL_TABLET | ORAL | 0 refills | Status: DC
Start: 2018-04-22 — End: 2018-05-25

## 2018-04-22 MED ORDER — ALBUTEROL SULFATE HFA 108 (90 BASE) MCG/ACT IN AERS: INHALATION_SPRAY | RESPIRATORY_TRACT | 5 refills | Status: DC

## 2018-04-22 MED ORDER — PREDNISONE 10 MG PO TABS
ORAL_TABLET | ORAL | 0 refills | Status: DC
Start: 2018-04-22 — End: 2018-05-25

## 2018-04-22 NOTE — Progress Notes (Signed)
Subjective:    Patient ID: Jodi Steele, female    DOB: 03-Mar-1951, 67 y.o.   MRN: [id number]  HPI Here for uri symptoms  Started early June with wheeze followed by cough   Saw someone - was px prednisone 5 d for wheezing  Improved for a week  Then wheezing came back/some coughing  A little phlegm - pale in color   Has had to use her inhaler- every 4 hours/ feels like she needs more   No fever  No nasal symptoms or sore throat    Wt Readings from Last 3 Encounters:  04/22/18 183 lb 12 oz (83.3 kg)  04/05/18 182 lb (82.6 kg)  09/14/17 184 lb 4 oz (83.6 kg)    Patient Active Problem List   Diagnosis Date Noted  . Bronchitis with bronchospasm 04/22/2018  . Osteopenia 01/18/2018  . Estrogen deficiency 09/14/2017  . Colon cancer screening 06/27/2015  . Encounter for routine gynecological examination 12/10/2012  . Obesity 11/28/2011  . Other screening mammogram 08/22/2011  . Routine general medical examination at a health care facility 08/14/2011  . Hypothyroidism 09/04/2008  . PURE HYPERCHOLESTEROLEMIA 09/04/2008  . HYPERTENSION, BENIGN ESSENTIAL 04/20/2007  . DEPRESSION 04/07/2007  . ASTHMA 04/07/2007  . FIBROCYSTIC BREAST DISEASE 04/07/2007  . BUNION 04/07/2007   Past Medical History:  Diagnosis Date  . Asthma    related to pet/dog exposure  . Bunion   . Colon polyp   . Depression   . Diffuse cystic mastopathy   . Goiter, unspecified    surgery  . HTN (hypertension)   . Hypothyroidism   . Pure hypercholesterolemia    Past Surgical History:  Procedure Laterality Date  . BREAST BIOPSY Right    1985 abcsess  . BREAST CYST ASPIRATION Left    negative  . COLONOSCOPY  11/10   polyp  . INCISION AND DRAINAGE BREAST ABSCESS     right  . THYROIDECTOMY    . TUBAL LIGATION     Social History   Tobacco Use  . Smoking status: Former Smoker    Last attempt to quit: 10/13/1988    Years since quitting: 29.5  . Smokeless tobacco: Never Used  Substance  Use Topics  . Alcohol use: Yes    Alcohol/week: 0.0 oz    Comment: daily usually wine  . Drug use: No   Family History  Problem Relation Age of Onset  . Other Father        Mesothelioma  . Thyroid cancer Mother        Anaplastic thyroid cancer  . Hypertension Mother   . Diabetes Mother   . Thyroid cancer Sister   . Diabetes Unknown        GF  . Breast cancer Neg Hx    No Known Allergies Current Outpatient Medications on File Prior to Visit  Medication Sig Dispense Refill  . Calcium Carbonate (CALCIUM 600 PO) Take 1,200 mg by mouth daily.     . Ginkgo Biloba 40 MG TABS Take 1 tablet by mouth daily.      Marland Kitchen. lisinopril (PRINIVIL,ZESTRIL) 5 MG tablet Take 1 tablet (5 mg total) by mouth daily. 30 tablet 11  . SYNTHROID 137 MCG tablet Take 1 tablet (137 mcg total) by mouth daily before breakfast. 30 tablet 11  . vitamin C (ASCORBIC ACID) 500 MG tablet Take 500 mg by mouth daily.      No current facility-administered medications on file prior to visit.     Review  of Systems  Constitutional: Negative for activity change, appetite change, fatigue, fever and unexpected weight change.  HENT: Negative for congestion, ear pain, rhinorrhea, sinus pressure and sore throat.   Eyes: Negative for pain, redness and visual disturbance.  Respiratory: Positive for cough, chest tightness and wheezing. Negative for shortness of breath and stridor.   Cardiovascular: Negative for chest pain and palpitations.  Gastrointestinal: Negative for abdominal pain, blood in stool, constipation and diarrhea.  Endocrine: Negative for polydipsia and polyuria.  Genitourinary: Negative for dysuria, frequency and urgency.  Musculoskeletal: Negative for arthralgias, back pain and myalgias.  Skin: Negative for pallor and rash.  Allergic/Immunologic: Negative for environmental allergies.  Neurological: Negative for dizziness, syncope and headaches.  Hematological: Negative for adenopathy. Does not bruise/bleed easily.    Psychiatric/Behavioral: Negative for decreased concentration and dysphoric mood. The patient is not nervous/anxious.        Objective:   Physical Exam  Constitutional: She appears well-developed and well-nourished. No distress.  overwt and well app  HENT:  Head: Normocephalic and atraumatic.  Right Ear: External ear normal.  Left Ear: External ear normal.  Mouth/Throat: Oropharynx is clear and moist.  Nares are injected and boggy No sinus tenderness Clear rhinorrhea and post nasal drip -mild  Eyes: Pupils are equal, round, and reactive to light. Conjunctivae and EOM are normal. Right eye exhibits no discharge. Left eye exhibits no discharge.  Neck: Normal range of motion. Neck supple.  Cardiovascular: Normal rate and normal heart sounds.  Pulmonary/Chest: Effort normal. No respiratory distress. She has wheezes. She has no rales. She exhibits no tenderness.  Diffuse exp wheeze slt inc exp phase  No rales or crackles Scattered rhonchi  Lymphadenopathy:    She has no cervical adenopathy.  Neurological: She is alert.  Skin: Skin is warm and dry. No rash noted.  Psychiatric: She has a normal mood and affect.  Pleasant           Assessment & Plan:   Problem List Items Addressed This Visit      Respiratory   Bronchitis with bronchospasm    With wheezing that briefly imp with 5 d of prednisone  Symptoms over a month  Cover with zpak in light of length of illness Prednisone taper 40 mg (12 days)-disc side eff  Fluids/rest mucinex if needed  Update if not starting to improve in a week or if worsening

## 2018-04-22 NOTE — Telephone Encounter (Signed)
-----   Message -----  From: Mickeal Needyhristine A Thane  Sent: 04/22/2018  7:14 AM  To: [facility]  Subject: Appointment Request                 ----- Message from AlamoMychart, Generic sent at 04/22/2018 7:14 AM EDT -----    Appointment Request From: Jodi Steele    With Provider: Roxy MannsMarne Tower, MD [facility]    Preferred Date Range: Any date 04/22/2018 or later    Preferred Times: Any time    Reason for visit: Request an Appointment    Comments:  Shortness of breath, coughing   I spoke with pt; pt stated started early June with coughing and wheezing. [place] seen with county provider and was given prednisone which helped and  Restarted coughing and wheezing first of July. Now non prod cough with wheezing on and off; using albuterol inhaler which helps.No fever. No SOB. Pt scheduled appt to see Dr Milinda Antisower 04/22/18 at 9 AM.

## 2018-04-22 NOTE — Patient Instructions (Signed)
Drink fluids and try to get more rest  Take the zpack for bronchitis Also the prednisone taper - 40 mg   Use inhaler as needed   Update if not starting to improve in a week or if worsening   Or if you get better and then worse again   mucinex for cough over the counter if needed

## 2018-04-22 NOTE — Assessment & Plan Note (Signed)
With wheezing that briefly imp with 5 d of prednisone  Symptoms over a month  Cover with zpak in light of length of illness Prednisone taper 40 mg (12 days)-disc side eff  Fluids/rest mucinex if needed  Update if not starting to improve in a week or if worsening

## 2018-05-25 ENCOUNTER — Ambulatory Visit (INDEPENDENT_AMBULATORY_CARE_PROVIDER_SITE_OTHER)
Admission: RE | Admit: 2018-05-25 | Discharge: 2018-05-25 | Disposition: A | Discharge status: Home / Self Care | Payer: Managed Care, Other (non HMO) | Source: Ambulatory Visit | Attending: Family Medicine | Admitting: Family Medicine

## 2018-05-25 ENCOUNTER — Other Ambulatory Visit: Payer: Self-pay | Admitting: Family Medicine

## 2018-05-25 ENCOUNTER — Encounter: Payer: Self-pay | Admitting: Family Medicine

## 2018-05-25 ENCOUNTER — Telehealth: Payer: Self-pay | Admitting: Family Medicine

## 2018-05-25 ENCOUNTER — Ambulatory Visit: Payer: Managed Care, Other (non HMO) | Admitting: Family Medicine

## 2018-05-25 VITALS — BP 132/86 | HR 70 | Temp 98.2°F | Ht 64.0 in | Wt 183.0 lb

## 2018-05-25 DIAGNOSIS — J209 Acute bronchitis, unspecified: Secondary | ICD-10-CM | POA: Diagnosis not present

## 2018-05-25 NOTE — Patient Instructions (Signed)
Let's check a chest xray today  Use your albuterol inhaler as needed   We will call with results and plan

## 2018-05-25 NOTE — Progress Notes (Signed)
Subjective:    Patient ID: Jodi Steele, female    DOB: 09-13-1951, 67 y.o.   MRN: [id number]  HPI Here with wheezing and coughing   Seen 7/11 for bronchitis with bronchospasm  A/p  Bronchitis with bronchospasm     With wheezing that briefly imp with 5 d of prednisone  Symptoms over a month  Cover with zpak in light of length of illness Prednisone taper 40 mg (12 days)-disc side eff  Fluids/rest mucinex if needed  Update if not starting to improve in a week or if worsening     Pulse ox is 94% on RA today   She improved for about a week and then became worse  She just came back from [place]   Now having wheezing with exertion  Heat does not make it worse  Coughing is worse -more productive (pale to slt yellow)  No blood   No nasal symptoms No throat or ear symptoms  No fever  No body aches or chills  Does have hot flashes/night sweats   Is using albuterol inhaler as often as she can/every 4 hours -it helps some  Also sitting still  Some fatigue    Past- has wheezed from allergy exposure in the past (just animals)   Patient Active Problem List   Diagnosis Date Noted  . Bronchitis with bronchospasm 04/22/2018  . Osteopenia 01/18/2018  . Estrogen deficiency 09/14/2017  . Colon cancer screening 06/27/2015  . Encounter for routine gynecological examination 12/10/2012  . Obesity 11/28/2011  . Other screening mammogram 08/22/2011  . Routine general medical examination at a health care facility 08/14/2011  . Hypothyroidism 09/04/2008  . PURE HYPERCHOLESTEROLEMIA 09/04/2008  . HYPERTENSION, BENIGN ESSENTIAL 04/20/2007  . DEPRESSION 04/07/2007  . ASTHMA 04/07/2007  . FIBROCYSTIC BREAST DISEASE 04/07/2007  . BUNION 04/07/2007   Past Medical History:  Diagnosis Date  . Asthma    related to pet/dog exposure  . Bunion   . Colon polyp   . Depression   . Diffuse cystic mastopathy   . Goiter, unspecified    surgery  . HTN (hypertension)   . Hypothyroidism    . Pure hypercholesterolemia    Past Surgical History:  Procedure Laterality Date  . BREAST BIOPSY Right    1985 abcsess  . BREAST CYST ASPIRATION Left    negative  . COLONOSCOPY  11/10   polyp  . INCISION AND DRAINAGE BREAST ABSCESS     right  . THYROIDECTOMY    . TUBAL LIGATION     Social History   Tobacco Use  . Smoking status: Former Smoker    Last attempt to quit: 10/13/1988    Years since quitting: 29.6  . Smokeless tobacco: Never Used  Substance Use Topics  . Alcohol use: Yes    Alcohol/week: 0.0 standard drinks    Comment: daily usually wine  . Drug use: No   Family History  Problem Relation Age of Onset  . Other Father        Mesothelioma  . Thyroid cancer Mother        Anaplastic thyroid cancer  . Hypertension Mother   . Diabetes Mother   . Thyroid cancer Sister   . Diabetes Unknown        GF  . Breast cancer Neg Hx    No Known Allergies Current Outpatient Medications on File Prior to Visit  Medication Sig Dispense Refill  . albuterol (VENTOLIN HFA) 108 (90 Base) MCG/ACT inhaler inhale 2 puffs by mouth  INTO THE LUNGS every 4 hours if needed for wheezing 18 g 5  . Calcium Carbonate (CALCIUM 600 PO) Take 1,200 mg by mouth daily.     . Ginkgo Biloba 40 MG TABS Take 1 tablet by mouth daily.      Marland Kitchen. lisinopril (PRINIVIL,ZESTRIL) 5 MG tablet Take 1 tablet (5 mg total) by mouth daily. 30 tablet 11  . SYNTHROID 137 MCG tablet Take 1 tablet (137 mcg total) by mouth daily before breakfast. 30 tablet 11  . vitamin C (ASCORBIC ACID) 500 MG tablet Take 500 mg by mouth daily.      No current facility-administered medications on file prior to visit.     Review of Systems  Constitutional: Positive for fatigue. Negative for activity change, appetite change, fever and unexpected weight change.  HENT: Negative for congestion, ear pain, postnasal drip, rhinorrhea, sinus pressure, sinus pain, sore throat and voice change.   Eyes: Negative for pain, redness and visual  disturbance.  Respiratory: Positive for cough, chest tightness and wheezing. Negative for choking, shortness of breath and stridor.   Cardiovascular: Negative for chest pain and palpitations.  Gastrointestinal: Negative for abdominal pain, blood in stool, constipation and diarrhea.  Endocrine: Negative for polydipsia and polyuria.  Genitourinary: Negative for dysuria, frequency and urgency.  Musculoskeletal: Negative for arthralgias, back pain and myalgias.  Skin: Negative for pallor and rash.  Allergic/Immunologic: Negative for environmental allergies.  Neurological: Negative for dizziness, syncope and headaches.  Hematological: Negative for adenopathy. Does not bruise/bleed easily.  Psychiatric/Behavioral: Negative for decreased concentration and dysphoric mood. The patient is not nervous/anxious.        Objective:   Physical Exam  Constitutional: She is oriented to person, place, and time. She appears well-developed and well-nourished. No distress.  obese and well appearing   HENT:  Head: Normocephalic and atraumatic.  Right Ear: External ear normal.  Left Ear: External ear normal.  Nose: Nose normal.  Mouth/Throat: Oropharynx is clear and moist.  Boggy nares   Eyes: Pupils are equal, round, and reactive to light. Conjunctivae and EOM are normal. Right eye exhibits no discharge. Left eye exhibits no discharge. No scleral icterus.  Neck: Normal range of motion. Neck supple.  Cardiovascular: Normal rate, regular rhythm and normal heart sounds.  Pulmonary/Chest: Effort normal. No stridor. No respiratory distress. She has wheezes. She has no rales. She exhibits no tenderness.  insp and exp wheezes heard /scattered w/o prolonged exp phase  Dry cough  No rales or rhonchi    Abdominal: She exhibits no distension.  Lymphadenopathy:    She has no cervical adenopathy.  Neurological: She is alert and oriented to person, place, and time. She displays normal reflexes. No cranial nerve  deficit. Coordination normal.  Skin: Skin is warm and dry. No rash noted. No pallor.  Psychiatric: She has a normal mood and affect.          Assessment & Plan:   Problem List Items Addressed This Visit      Respiratory   Bronchitis with bronchospasm - Primary    Improved twice with prednisone/then zpack Now cough and wheeze are back Wheeze on exam  CXR ordered  Disc hx of symptoms in the past-allergic to animals and tends to wheeze when sick Recently in [place] (no allergens)-she still wheezed  Pending cxr report-consider inhaled steroid/bronchodilator combo with close f/u  Consider spirometry if not improving  Update if not starting to improve in a week or if worsening  Relevant Orders   DG Chest 2 View (Completed)

## 2018-05-25 NOTE — Telephone Encounter (Signed)
Opened in error

## 2018-05-25 NOTE — Telephone Encounter (Signed)
Chest xray looks clear-that is re assuring   I wrote for advair for her to try I will pend it to verify what pharmacy she prefers   This is a combo of a long acting bronchodilator and an inhaled steroid  She can still use albuterol as needed   If not affordable -please have her ask which brand is preferred with her insurance   inst her to call us in 2-4 days to let me know how it is working and then we will schedule f/u   Thanks

## 2018-05-25 NOTE — Assessment & Plan Note (Addendum)
Improved twice with prednisone/then zpack Now cough and wheeze are back Wheeze on exam  CXR ordered  Disc hx of symptoms in the past-allergic to animals and tends to wheeze when sick Recently in [place] (no allergens)-she still wheezed  Pending cxr report-consider inhaled steroid/bronchodilator combo with close f/u  Consider spirometry if not improving  Update if not starting to improve in a week or if worsening  Consider holding ace

## 2018-05-26 MED ORDER — FLUTICASONE-SALMETEROL 100-50 MCG/DOSE IN AEPB: 1.0000 | INHALATION_SPRAY | Freq: Two times a day (BID) | RESPIRATORY_TRACT | 1 refills | Status: DC

## 2018-05-26 NOTE — Telephone Encounter (Signed)
Pt notified of xray results and Dr. Royden Purlower's comments and instructions and Rx sent to pharmacy. Pt will update us on how she's doing but she did say she's feeling a little better today

## 2018-06-02 ENCOUNTER — Telehealth: Payer: Self-pay

## 2018-06-02 NOTE — Telephone Encounter (Signed)
I would like her to use it for about a month since it is helping If symptoms return when she stops it then let me know plese  Glad it is helping

## 2018-06-02 NOTE — Telephone Encounter (Signed)
Pt notified of Dr. Tower's instructions and verbalized understanding  

## 2018-06-02 NOTE — Telephone Encounter (Signed)
Copied from CRM [id number]. Topic: General - Other >> Jun 02, 2018  3:00 PM Gerrianne ScalePayne, Angela L wrote: Reason for CRM: pt calling stating that the Fluticasone-Salmeterol (ADVAIR) 100-50 MCG/DOSE AEPB helped a lot she's doing well and want to know how long should she use the inhaler

## 2018-07-25 ENCOUNTER — Other Ambulatory Visit: Payer: Self-pay | Admitting: Family Medicine

## 2018-09-05 ENCOUNTER — Telehealth: Payer: Self-pay | Admitting: Family Medicine

## 2018-09-05 DIAGNOSIS — E890 Postprocedural hypothyroidism: Secondary | ICD-10-CM

## 2018-09-05 DIAGNOSIS — E7800 Pure hypercholesterolemia, unspecified: Secondary | ICD-10-CM

## 2018-09-05 DIAGNOSIS — I10 Essential (primary) hypertension: Secondary | ICD-10-CM

## 2018-09-05 NOTE — Telephone Encounter (Signed)
-----   Message from Wendi MayaLauren Greeson, RT sent at 08/30/2018  7:53 AM EST ----- Regarding: Lab orders for Monday 09/06/18 Please enter CPE lab orders for 09/06/18. Thanks!

## 2018-09-06 ENCOUNTER — Other Ambulatory Visit (INDEPENDENT_AMBULATORY_CARE_PROVIDER_SITE_OTHER): Payer: Managed Care, Other (non HMO)

## 2018-09-06 DIAGNOSIS — E890 Postprocedural hypothyroidism: Secondary | ICD-10-CM | POA: Diagnosis not present

## 2018-09-06 DIAGNOSIS — E7800 Pure hypercholesterolemia, unspecified: Secondary | ICD-10-CM | POA: Diagnosis not present

## 2018-09-06 DIAGNOSIS — I10 Essential (primary) hypertension: Secondary | ICD-10-CM

## 2018-09-06 LAB — LIPID PANEL
Cholesterol: 215 mg/dL — ABNORMAL HIGH (ref 0–200)
HDL: 56.8 mg/dL (ref >39.00)
LDL Cholesterol: 138 mg/dL — ABNORMAL HIGH (ref 0–99)
NonHDL: 158.22
Total CHOL/HDL Ratio: 4
Triglycerides: 102 mg/dL (ref 0.0–149.0)
VLDL: 20.4 mg/dL (ref 0.0–40.0)

## 2018-09-06 LAB — COMPREHENSIVE METABOLIC PANEL
ALBUMIN: 4 g/dL (ref 3.5–5.2)
ALK PHOS: 76 U/L (ref 39–117)
ALT: 12 U/L (ref 0–35)
AST: 16 U/L (ref 0–37)
BUN: 16 mg/dL (ref 6–23)
CALCIUM: 9.4 mg/dL (ref 8.4–10.5)
CHLORIDE: 103 meq/L (ref 96–112)
CO2: 28 mEq/L (ref 19–32)
CREATININE: 0.79 mg/dL (ref 0.40–1.20)
GFR: 77.23 mL/min (ref >60.00)
Glucose, Bld: 99 mg/dL (ref 70–99)
Potassium: 4.1 mEq/L (ref 3.5–5.1)
SODIUM: 139 meq/L (ref 135–145)
TOTAL PROTEIN: 6.6 g/dL (ref 6.0–8.3)
Total Bilirubin: 0.4 mg/dL (ref 0.2–1.2)

## 2018-09-06 LAB — CBC WITH DIFFERENTIAL/PLATELET
BASOS ABS: 0 10*3/uL (ref 0.0–0.1)
Basophils Relative: 0.4 % (ref 0.0–3.0)
EOS ABS: 0.5 10*3/uL (ref 0.0–0.7)
Eosinophils Relative: 6.6 % — ABNORMAL HIGH (ref 0.0–5.0)
HEMATOCRIT: 35.1 % — AB (ref 36.0–46.0)
HEMOGLOBIN: 11.6 g/dL — AB (ref 12.0–15.0)
LYMPHS PCT: 40.3 % (ref 12.0–46.0)
Lymphs Abs: 3.2 10*3/uL (ref 0.7–4.0)
MCHC: 33 g/dL (ref 30.0–36.0)
MCV: 87.9 fl (ref 78.0–100.0)
MONO ABS: 0.7 10*3/uL (ref 0.1–1.0)
Monocytes Relative: 8.9 % (ref 3.0–12.0)
Neutro Abs: 3.5 10*3/uL (ref 1.4–7.7)
Neutrophils Relative %: 43.8 % (ref 43.0–77.0)
Platelets: 286 10*3/uL (ref 150.0–400.0)
RBC: 3.99 Mil/uL (ref 3.87–5.11)
RDW: 14 % (ref 11.5–15.5)
WBC: 7.9 10*3/uL (ref 4.0–10.5)

## 2018-09-06 LAB — TSH: TSH: 1.58 u[IU]/mL (ref 0.35–4.50)

## 2018-09-07 ENCOUNTER — Other Ambulatory Visit: Payer: Self-pay | Admitting: Family Medicine

## 2018-09-15 ENCOUNTER — Ambulatory Visit (INDEPENDENT_AMBULATORY_CARE_PROVIDER_SITE_OTHER): Payer: Managed Care, Other (non HMO) | Admitting: Family Medicine

## 2018-09-15 ENCOUNTER — Encounter: Payer: Self-pay | Admitting: Family Medicine

## 2018-09-15 VITALS — BP 118/78 | HR 73 | Temp 98.4°F | Ht 63.75 in | Wt 183.2 lb

## 2018-09-15 DIAGNOSIS — E890 Postprocedural hypothyroidism: Secondary | ICD-10-CM

## 2018-09-15 DIAGNOSIS — Z23 Encounter for immunization: Secondary | ICD-10-CM | POA: Diagnosis not present

## 2018-09-15 DIAGNOSIS — Z6831 Body mass index (BMI) 31.0-31.9, adult: Secondary | ICD-10-CM

## 2018-09-15 DIAGNOSIS — I10 Essential (primary) hypertension: Secondary | ICD-10-CM

## 2018-09-15 DIAGNOSIS — Z0000 Encounter for general adult medical examination without abnormal findings: Secondary | ICD-10-CM | POA: Diagnosis not present

## 2018-09-15 DIAGNOSIS — E7800 Pure hypercholesterolemia, unspecified: Secondary | ICD-10-CM

## 2018-09-15 DIAGNOSIS — M85859 Other specified disorders of bone density and structure, unspecified thigh: Secondary | ICD-10-CM | POA: Diagnosis not present

## 2018-09-15 DIAGNOSIS — E6609 Other obesity due to excess calories: Secondary | ICD-10-CM

## 2018-09-15 DIAGNOSIS — Z1211 Encounter for screening for malignant neoplasm of colon: Secondary | ICD-10-CM

## 2018-09-15 MED ORDER — ALBUTEROL SULFATE HFA 108 (90 BASE) MCG/ACT IN AERS: INHALATION_SPRAY | RESPIRATORY_TRACT | 5 refills | Status: DC

## 2018-09-15 MED ORDER — SYNTHROID 137 MCG PO TABS: ORAL_TABLET | ORAL | 11 refills | Status: DC

## 2018-09-15 MED ORDER — LISINOPRIL 5 MG PO TABS: 5.0000 mg | ORAL_TABLET | Freq: Every day | ORAL | 11 refills | Status: DC

## 2018-09-15 NOTE — Patient Instructions (Addendum)
Don't forget to schedule your mammogram   Pneumovax 23 vaccine today   For cholesterol  Avoid red meat/ fried foods/ egg yolks/ fatty breakfast meats/ butter, cheese and high fat dairy/ and shellfish   Your LDL is high - we want to see if this improves  Let's re check in 3-6 months   Follow up here for wart on your left hand or we can refer you to dermatology   Keep taking good care of yourself

## 2018-09-15 NOTE — Assessment & Plan Note (Signed)
bp in fair control at this time  BP Readings from Last 1 Encounters:  09/15/18 118/78   No changes needed Most recent labs reviewed  Disc lifstyle change with low sodium diet and exercise

## 2018-09-15 NOTE — Assessment & Plan Note (Signed)
Colonoscopy 2018 with 3 y recall for polyps

## 2018-09-15 NOTE — Assessment & Plan Note (Signed)
Disc goals for lipids and reasons to control them Rev last labs with pt Rev low sat fat diet in detail  LDL is improved at 138 but not at goal of 100 or less Will watch diet  Re check 3-6 mo  Consider statin if not at goal

## 2018-09-15 NOTE — Assessment & Plan Note (Signed)
Reviewed health habits including diet and exercise and skin cancer prevention Reviewed appropriate screening tests for age  Also reviewed health mt list, fam hx and immunization status , as well as social and family history   See HPI Labs rev  Colonoscopy due 2021 Mammogram next mo -pt will schedule  Rev dexa  PNA 23 given  Counseled on high cholesterol /diet change

## 2018-09-15 NOTE — Assessment & Plan Note (Signed)
Discussed how this problem influences overall health and the risks it imposes  Reviewed plan for weight loss with lower calorie diet (via better food choices and also portion control or program like weight watchers) and exercise building up to or more than 30 minutes 5 days per week including some aerobic activity    

## 2018-09-15 NOTE — Assessment & Plan Note (Signed)
Hypothyroidism  Pt has no clinical changes No change in energy level/ hair or skin/ edema and no tremor Lab Results  Component Value Date   TSH 1.58 09/06/2018

## 2018-09-15 NOTE — Assessment & Plan Note (Signed)
Rev dexa 4/19 with openia of hip (total) Taking ca and D No falls or fractures Disc imp of exercise Re check 2 y

## 2018-09-15 NOTE — Progress Notes (Signed)
Subjective:    Patient ID: Jodi Steele, female    DOB: [birth year], 67 y.o.   MRN: [id number]  HPI Here for health maintenance exam and to review chronic medical problems    Feeling well overall  Trouble sleeping   Husband's cancer is back and he is on a new drug (prostate/mets)  Son and DIL split  Sister has Alz (young)   Not using medicare  Still working / wants to retire some day- not yet/torn about it   [employer] Readings from Last 3 Encounters:  09/15/18 183 lb 4 oz (83.1 kg)  05/25/18 183 lb (83 kg)  04/22/18 183 lb 12 oz (83.3 kg)  taking good care of herself  Eating well  Slow but steady wt loss  Uses fitbit and gets in steps / also gardening for upper body work  31.70 kg/m   Pap 9/17 with neg HPV reflex No gyn symptoms   Tetanus shot 10/13 Flu vaccine 10/19  Due for PPV23 vaccine -will get today   Mammogram 1/19  Self breast exam- no lumps  dexa 4/19-osteopenia at hip She increased her ca  Also taking vit D No falls or fractures  Exercise-walking   Zostavax 315  Colonoscopy 5/18 with Dr Markham JordanElliot  Large polyps seen - 3 year year recall   bp is stable today  No cp or palpitations or headaches or edema  No side effects to medicines  BP Readings from Last 3 Encounters:  09/15/18 118/78  05/25/18 132/86  04/22/18 132/80     Hypothyroidism  Pt has no clinical changes No change in energy level/ hair or skin/ edema and no tremor Lab Results  Component Value Date   TSH 1.58 09/06/2018      Hyperlipidemia Lab Results  Component Value Date   CHOL 215 (H) 09/06/2018   CHOL 225 (H) 04/05/2018   CHOL 227 (H) 09/14/2017   Lab Results  Component Value Date   HDL 56.80 09/06/2018   HDL 58 04/05/2018   HDL 56.10 09/14/2017   Lab Results  Component Value Date   LDLCALC 138 (H) 09/06/2018   LDLCALC 145 (H) 04/05/2018   LDLCALC 150 (H) 09/14/2017   Lab Results  Component Value Date   TRIG 102.0 09/06/2018   TRIG 108 04/05/2018   TRIG 105.0  09/14/2017   Lab Results  Component Value Date   CHOLHDL 4 09/06/2018   CHOLHDL 3.9 04/05/2018   CHOLHDL 4 09/14/2017   Lab Results  Component Value Date   LDLDIRECT 144.4 12/06/2013   LDLDIRECT 128.3 11/17/2011   LDLDIRECT 151.7 08/15/2011  diet - makes effort to avoid  Some eggs  Had eaten badly just before labs the week (eggs/cheese)  Avoids fried food  Some red meat  Fish    Mild anemia Lab Results  Component Value Date   WBC 7.9 09/06/2018   HGB 11.6 (L) 09/06/2018   HCT 35.1 (L) 09/06/2018   MCV 87.9 09/06/2018   PLT 286.0 09/06/2018   just gave double red cells a few weeks ago  Lab Results  Component Value Date   CREATININE 0.79 09/06/2018   BUN 16 09/06/2018   NA 139 09/06/2018   K 4.1 09/06/2018   CL 103 09/06/2018   CO2 28 09/06/2018   Lab Results  Component Value Date   ALT 12 09/06/2018   AST 16 09/06/2018   ALKPHOS 76 09/06/2018   BILITOT 0.4 09/06/2018    Using inhaled steroid since bronchitis  It makes her  a little hoarse  She stopped taking it when she got better   Patient Active Problem List   Diagnosis Date Noted  . Osteopenia 01/18/2018  . Estrogen deficiency 09/14/2017  . Colon cancer screening 06/27/2015  . Encounter for routine gynecological examination 12/10/2012  . Obesity 11/28/2011  . Other screening mammogram 08/22/2011  . Routine general medical examination at a health care facility 08/14/2011  . Hypothyroidism 09/04/2008  . PURE HYPERCHOLESTEROLEMIA 09/04/2008  . HYPERTENSION, BENIGN ESSENTIAL 04/20/2007  . DEPRESSION 04/07/2007  . ASTHMA 04/07/2007  . FIBROCYSTIC BREAST DISEASE 04/07/2007  . BUNION 04/07/2007   Past Medical History:  Diagnosis Date  . Asthma    related to pet/dog exposure  . Bunion   . Colon polyp   . Depression   . Diffuse cystic mastopathy   . Goiter, unspecified    surgery  . HTN (hypertension)   . Hypothyroidism   . Pure hypercholesterolemia    Past Surgical History:  Procedure  Laterality Date  . BREAST BIOPSY Right    1985 abcsess  . BREAST CYST ASPIRATION Left    negative  . COLONOSCOPY  11/10   polyp  . INCISION AND DRAINAGE BREAST ABSCESS     right  . THYROIDECTOMY    . TUBAL LIGATION     Social History   Tobacco Use  . Smoking status: Former Smoker    Last attempt to quit: 10/13/1988    Years since quitting: 29.9  . Smokeless tobacco: Never Used  Substance Use Topics  . Alcohol use: Yes    Alcohol/week: 0.0 standard drinks    Comment: daily usually wine  . Drug use: No   Family History  Problem Relation Age of Onset  . Other Father        Mesothelioma  . Thyroid cancer Mother        Anaplastic thyroid cancer  . Hypertension Mother   . Diabetes Mother   . Thyroid cancer Sister   . Diabetes Unknown        GF  . Breast cancer Neg Hx    No Known Allergies Current Outpatient Medications on File Prior to Visit  Medication Sig Dispense Refill  . Calcium Carbonate (CALCIUM 600 PO) Take 1,200 mg by mouth daily.     . Ginkgo Biloba 40 MG TABS Take 1 tablet by mouth daily.      . vitamin C (ASCORBIC ACID) 500 MG tablet Take 500 mg by mouth daily.      No current facility-administered medications on file prior to visit.     Review of Systems  Constitutional: Negative for activity change, appetite change, fatigue, fever and unexpected weight change.  HENT: Negative for congestion, ear pain, rhinorrhea, sinus pressure and sore throat.   Eyes: Negative for pain, redness and visual disturbance.  Respiratory: Negative for cough, shortness of breath and wheezing.   Cardiovascular: Negative for chest pain and palpitations.  Gastrointestinal: Negative for abdominal pain, blood in stool, constipation and diarrhea.  Endocrine: Negative for polydipsia and polyuria.  Genitourinary: Negative for dysuria, frequency and urgency.  Musculoskeletal: Negative for arthralgias, back pain and myalgias.  Skin: Negative for pallor and rash.       Wart on L hand R  2nd toe nail is thick  Allergic/Immunologic: Negative for environmental allergies.  Neurological: Negative for dizziness, syncope and headaches.  Hematological: Negative for adenopathy. Does not bruise/bleed easily.  Psychiatric/Behavioral: Negative for decreased concentration and dysphoric mood. The patient is not nervous/anxious.  Objective:   Physical Exam  Constitutional: She appears well-developed and well-nourished. No distress.  obese and well appearing   HENT:  Head: Normocephalic and atraumatic.  Right Ear: External ear normal.  Left Ear: External ear normal.  Mouth/Throat: Oropharynx is clear and moist.  Eyes: Pupils are equal, round, and reactive to light. Conjunctivae and EOM are normal. No scleral icterus.  Neck: Normal range of motion. Neck supple. No JVD present. Carotid bruit is not present. No thyromegaly present.  Cardiovascular: Normal rate, regular rhythm, normal heart sounds and intact distal pulses. Exam reveals no gallop.  Pulmonary/Chest: Effort normal and breath sounds normal. No stridor. No respiratory distress. She has no wheezes. She has no rales. She exhibits no tenderness. No breast tenderness, discharge or bleeding.  Abdominal: Soft. Bowel sounds are normal. She exhibits no distension, no abdominal bruit and no mass. There is no tenderness.  Genitourinary: No breast tenderness, discharge or bleeding.  Musculoskeletal: Normal range of motion. She exhibits no edema, tenderness or deformity.  Lymphadenopathy:    She has no cervical adenopathy.  Neurological: She is alert. She has normal reflexes. She displays normal reflexes. No cranial nerve deficit. She exhibits normal muscle tone. Coordination normal.  Skin: Skin is warm and dry. No rash noted. No erythema. No pallor.  R 2nd toe nail is discolored and thick but well trimmed   L 4th finger-simple wart   Psychiatric: She has a normal mood and affect.          Assessment & Plan:   Problem List  Items Addressed This Visit      Cardiovascular and Mediastinum   HYPERTENSION, BENIGN ESSENTIAL    bp in fair control at this time  BP Readings from Last 1 Encounters:  09/15/18 118/78   No changes needed Most recent labs reviewed  Disc lifstyle change with low sodium diet and exercise        Relevant Medications   lisinopril (PRINIVIL,ZESTRIL) 5 MG tablet     Endocrine   Hypothyroidism    Hypothyroidism  Pt has no clinical changes No change in energy level/ hair or skin/ edema and no tremor Lab Results  Component Value Date   TSH 1.58 09/06/2018          Relevant Medications   SYNTHROID 137 MCG tablet     Musculoskeletal and Integument   Osteopenia    Rev dexa 4/19 with openia of hip (total) Taking ca and D No falls or fractures Disc imp of exercise Re check 2 y         Other   Routine general medical examination at a health care facility - Primary    Reviewed health habits including diet and exercise and skin cancer prevention Reviewed appropriate screening tests for age  Also reviewed health mt list, fam hx and immunization status , as well as social and family history   See HPI Labs rev  Colonoscopy due 2021 Mammogram next mo -pt will schedule  Rev dexa  PNA 23 given  Counseled on high cholesterol /diet change        PURE HYPERCHOLESTEROLEMIA    Disc goals for lipids and reasons to control them Rev last labs with pt Rev low sat fat diet in detail  LDL is improved at 138 but not at goal of 100 or less Will watch diet  Re check 3-6 mo  Consider statin if not at goal      Relevant Medications   lisinopril (PRINIVIL,ZESTRIL) 5 MG tablet  Obesity    Discussed how this problem influences overall health and the risks it imposes  Reviewed plan for weight loss with lower calorie diet (via better food choices and also portion control or program like weight watchers) and exercise building up to or more than 30 minutes 5 days per week including some  aerobic activity         Colon cancer screening    Colonoscopy 2018 with 3 y recall for polyps        Other Visit Diagnoses    Need for 23-polyvalent pneumococcal polysaccharide vaccine       Relevant Orders   Pneumococcal polysaccharide vaccine 23-valent greater than or equal to 2yo subcutaneous/IM (Completed)

## 2018-09-27 ENCOUNTER — Other Ambulatory Visit: Payer: Self-pay | Admitting: Family Medicine

## 2018-09-27 DIAGNOSIS — Z1231 Encounter for screening mammogram for malignant neoplasm of breast: Secondary | ICD-10-CM

## 2018-10-06 ENCOUNTER — Other Ambulatory Visit: Payer: Self-pay | Admitting: Family Medicine

## 2018-10-07 ENCOUNTER — Other Ambulatory Visit: Payer: Self-pay | Admitting: Family Medicine

## 2018-10-27 ENCOUNTER — Ambulatory Visit
Admission: RE | Admit: 2018-10-27 | Discharge: 2018-10-27 | Disposition: A | Discharge status: Home / Self Care | Payer: Managed Care, Other (non HMO) | Source: Ambulatory Visit | Attending: Family Medicine | Admitting: Family Medicine

## 2018-10-27 DIAGNOSIS — Z1231 Encounter for screening mammogram for malignant neoplasm of breast: Secondary | ICD-10-CM | POA: Diagnosis present

## 2018-12-09 ENCOUNTER — Telehealth: Payer: Self-pay

## 2018-12-09 NOTE — Telephone Encounter (Signed)
pts team health note was not in computer today; TH faxed a note that pt was returning a missed call.

## 2018-12-09 NOTE — Telephone Encounter (Signed)
I didn't call pt 

## 2018-12-09 NOTE — Telephone Encounter (Signed)
Shapale, Did you get this message about a missed call?

## 2018-12-13 ENCOUNTER — Other Ambulatory Visit (INDEPENDENT_AMBULATORY_CARE_PROVIDER_SITE_OTHER): Payer: Managed Care, Other (non HMO)

## 2018-12-13 DIAGNOSIS — E7800 Pure hypercholesterolemia, unspecified: Secondary | ICD-10-CM

## 2018-12-13 LAB — LIPID PANEL
Cholesterol: 198 mg/dL (ref 0–200)
HDL: 58.3 mg/dL (ref >39.00)
LDL CALC: 128 mg/dL — AB (ref 0–99)
NonHDL: 140.12
Total CHOL/HDL Ratio: 3
Triglycerides: 62 mg/dL (ref 0.0–149.0)
VLDL: 12.4 mg/dL (ref 0.0–40.0)

## 2018-12-28 ENCOUNTER — Other Ambulatory Visit: Payer: Self-pay | Admitting: Family Medicine

## 2019-04-13 ENCOUNTER — Telehealth: Payer: Self-pay | Admitting: Family Medicine

## 2019-04-13 NOTE — Telephone Encounter (Signed)
Patient called and said in December two prescriptions for Albuterol were sent to the pharmacy. Patient was getting a stockpile of Albuterol. A couple month later patient let pharmacy know that she didn't need two. After she let them know that the rx was wrong, they called our office to get a new rx for one Albuterol and the pharmacy was told it was denied.  Now patient doesn't have any prescriptions for Albuterol. [facility].

## 2019-04-14 MED ORDER — ALBUTEROL SULFATE HFA 108 (90 BASE) MCG/ACT IN AERS: INHALATION_SPRAY | RESPIRATORY_TRACT | 5 refills | Status: DC

## 2019-04-14 NOTE — Telephone Encounter (Signed)
Rx refilled to pharmacy on file and pt notified

## 2019-07-25 ENCOUNTER — Telehealth: Payer: Self-pay

## 2019-07-25 ENCOUNTER — Other Ambulatory Visit: Payer: Self-pay

## 2019-07-25 ENCOUNTER — Encounter: Payer: Self-pay | Admitting: Family Medicine

## 2019-07-25 ENCOUNTER — Ambulatory Visit: Payer: Managed Care, Other (non HMO) | Admitting: Family Medicine

## 2019-07-25 VITALS — BP 136/86 | HR 86 | Temp 97.8°F | Ht 63.75 in | Wt 177.4 lb

## 2019-07-25 DIAGNOSIS — E890 Postprocedural hypothyroidism: Secondary | ICD-10-CM | POA: Diagnosis not present

## 2019-07-25 DIAGNOSIS — L989 Disorder of the skin and subcutaneous tissue, unspecified: Secondary | ICD-10-CM | POA: Insufficient documentation

## 2019-07-25 MED ORDER — LEVOTHYROXINE SODIUM 137 MCG PO TABS: ORAL_TABLET | ORAL | 11 refills | Status: DC

## 2019-07-25 NOTE — Assessment & Plan Note (Signed)
2 mm area of dark discoloration superficially  Able to debride a small amt with 22 G needle and lighter underneath  Suspect injury/blood blister that is healing (per pt also sudden onset after jumping)  Enc her to keep area clean (soap and water) and dry  If no imp in 1-2 wk-suggest dermatology ref Enc well fitting shoes that are protective

## 2019-07-25 NOTE — Assessment & Plan Note (Signed)
Ins will no longer pay for DAW levothyroxine Generic px written  inst up update if any problems

## 2019-07-25 NOTE — Telephone Encounter (Signed)
Per chart review tab in office visit is in progress. FYI to Dr Glori Bickers.

## 2019-07-25 NOTE — Progress Notes (Signed)
Subjective:    Patient ID: Jodi Steele, female    DOB: 1951-04-06, 68 y.o.   MRN: [id number]  HPI  Pt presents with concern about toe   Needs new px for generic levothyroxine Ins no longer covers DAW   Left great toe has a dark smpt at lateral nail edge   She jumped off a wall/slid and landed on buttocks - did not remember a foot injury Next day she noted it when she got in shower  May have worn flip flops  Does not hurt  Thought it was a blood blister- she lanced it and a little blood came out  She looked at inter net-worried about skin cancer   Patient Active Problem List   Diagnosis Date Noted   Skin lesion of foot 07/25/2019   Osteopenia 01/18/2018   Estrogen deficiency 09/14/2017   Colon cancer screening 06/27/2015   Encounter for routine gynecological examination 12/10/2012   Obesity 11/28/2011   Other screening mammogram 08/22/2011   Routine general medical examination at a health care facility 08/14/2011   Hypothyroidism 09/04/2008   PURE HYPERCHOLESTEROLEMIA 09/04/2008   HYPERTENSION, BENIGN ESSENTIAL 04/20/2007   DEPRESSION 04/07/2007   ASTHMA 04/07/2007   FIBROCYSTIC BREAST DISEASE 04/07/2007   BUNION 04/07/2007   Past Medical History:  Diagnosis Date   Asthma    related to pet/dog exposure   Bunion    Colon polyp    Depression    Diffuse cystic mastopathy    Goiter, unspecified    surgery   HTN (hypertension)    Hypothyroidism    Pure hypercholesterolemia    Past Surgical History:  Procedure Laterality Date   BREAST BIOPSY Right 1985   abcsess   BREAST CYST ASPIRATION Left    negative   COLONOSCOPY  11/10   polyp   INCISION AND DRAINAGE BREAST ABSCESS     right   THYROIDECTOMY     TUBAL LIGATION     Social History   Tobacco Use   Smoking status: Former Smoker    Quit date: 10/13/1988    Years since quitting: 30.8   Smokeless tobacco: Never Used  Substance Use Topics   Alcohol use: Yes   Alcohol/week: 0.0 standard drinks    Comment: daily usually wine   Drug use: No   Family History  Problem Relation Age of Onset   Other Father        Mesothelioma   Thyroid cancer Mother        Anaplastic thyroid cancer   Hypertension Mother    Diabetes Mother    Thyroid cancer Sister    Diabetes Other        GF   Breast cancer Neg Hx    No Known Allergies Current Outpatient Medications on File Prior to Visit  Medication Sig Dispense Refill   albuterol (VENTOLIN HFA) 108 (90 Base) MCG/ACT inhaler inhale 2 puffs by mouth INTO THE LUNGS every 4 hours if needed for wheezing 18 g 5   Calcium Carbonate (CALCIUM 600 PO) Take 1,200 mg by mouth daily.      lisinopril (PRINIVIL,ZESTRIL) 5 MG tablet Take 1 tablet (5 mg total) by mouth daily. 30 tablet 11   vitamin C (ASCORBIC ACID) 500 MG tablet Take 500 mg by mouth daily.      No current facility-administered medications on file prior to visit.     Review of Systems  Constitutional: Negative for activity change, appetite change, fatigue, fever and unexpected weight change.  HENT: Negative  for congestion, ear pain, rhinorrhea, sinus pressure and sore throat.   Eyes: Negative for pain, redness and visual disturbance.  Respiratory: Negative for cough, shortness of breath and wheezing.   Cardiovascular: Negative for chest pain and palpitations.  Gastrointestinal: Negative for abdominal pain, blood in stool, constipation and diarrhea.  Endocrine: Negative for polydipsia and polyuria.  Genitourinary: Negative for dysuria, frequency and urgency.  Musculoskeletal: Negative for arthralgias, back pain and myalgias.       Bunion deformities and foot pain -baseline   Skin: Negative for pallor and rash.  Allergic/Immunologic: Negative for environmental allergies.  Neurological: Negative for dizziness, syncope and headaches.  Hematological: Negative for adenopathy. Does not bruise/bleed easily.  Psychiatric/Behavioral: Negative for  decreased concentration and dysphoric mood. The patient is not nervous/anxious.        Objective:   Physical Exam Constitutional:      General: She is not in acute distress.    Appearance: Normal appearance. She is obese. She is not ill-appearing.  HENT:     Head: Normocephalic and atraumatic.     Mouth/Throat:     Mouth: Mucous membranes are moist.  Eyes:     General: No scleral icterus.    Extraocular Movements: Extraocular movements intact.     Conjunctiva/sclera: Conjunctivae normal.     Pupils: Pupils are equal, round, and reactive to light.  Neck:     Musculoskeletal: Neck supple.  Cardiovascular:     Rate and Rhythm: Normal rate and regular rhythm.  Pulmonary:     Effort: Pulmonary effort is normal. No respiratory distress.     Breath sounds: Normal breath sounds. No wheezing.  Musculoskeletal:     Right lower leg: No edema.     Left lower leg: No edema.     Comments: Bunion deformities of feet-medial  Skin:    General: Skin is warm and dry.     Coloration: Skin is not pale.     Findings: No rash.     Comments: 2 by 3 mm area of purple color /slt raised just medial to L great toe nail  Able to debride a small amt of top skin-area below is lighter  Consistent with bruise /blood blister   Great toe nails are mildly thickened  Neurological:     Mental Status: She is alert.     Sensory: No sensory deficit.  Psychiatric:        Mood and Affect: Mood normal.           Assessment & Plan:   Problem List Items Addressed This Visit      Musculoskeletal and Integument   Skin lesion of foot - Primary    2 mm area of dark discoloration superficially  Able to debride a small amt with 22 G needle and lighter underneath  Suspect injury/blood blister that is healing (per pt also sudden onset after jumping)  Enc her to keep area clean (soap and water) and dry  If no imp in 1-2 wk-suggest dermatology ref Jodi Steele well fitting shoes that are protective

## 2019-07-25 NOTE — Telephone Encounter (Signed)
[facility] Night - Client TELEPHONE ADVICE RECORD [facility] Patient Name: Jodi Steele Gender: Female DOB: [birth year] Age: 68 Y 52 M 17 D Return Phone Number: [phone] (Primary) Address: City/State/Zip: [place] [address] Client [facility] Night - Client Client Site [place] [facility], Roque Lias - MD Contact Type Call Who Is Calling Patient / Member / Family / Caregiver Call Type Triage / Clinical Relationship To Patient Self Return Phone Number [phone] (Primary) Chief Complaint Skin Lesion - Moles/ Lumps/ Growths Reason for Call Symptomatic / Request for [place] states has a black spot on her great toe that appeared suddenly. Concerned could be melanoma; Translation No Nurse Assessment Nurse: Leilani Merl, RN, Heather Date/Time (Eastern Time): 07/25/2019 8:56:34 AM Confirm and document reason for call. If symptomatic, describe symptoms. ---Caller states has a black spot on her great toe that appeared suddenly. Concerned could be melanoma; It appeared on Saturday. Has the patient had close contact with a person known or suspected to have the novel coronavirus illness OR traveled / lives in area with major community spread (including international travel) in the last 14 days from the onset of symptoms? * If Asymptomatic, screen for exposure and travel within the last 14 days. ---No Does the patient have any new or worsening symptoms? ---Yes Will a triage be completed? ---Yes Related visit to physician within the last 2 weeks? ---No Does the PT have any chronic conditions? (i.e. diabetes, asthma, this includes High risk factors for pregnancy, etc.) ---No Is this a behavioral health or substance abuse call? ---No Guidelines Guideline Title Affirmed Question Affirmed Notes Nurse Date/Time Eilene Ghazi Time) Skin Lesion - Moles or Growths Caller  can't describe it clearly Standifer, RN, Heather 07/25/2019 8:57:20 AM Disp. Time Eilene Ghazi Time) Disposition Final User 07/25/2019 8:58:35 AM SEE PCP WITHIN 3 DAYS Yes Standifer, RN, Heather PLEASE NOTE: All timestamps contained within this report are represented as [place] Standard Time. CONFIDENTIALTY NOTICE: This fax transmission is intended only for the addressee. It contains information that is legally privileged, confidential or otherwise protected from use or disclosure. If you are not the intended recipient, you are strictly prohibited from reviewing, disclosing, copying using or disseminating any of this information or taking any action in reliance on or regarding this information. If you have received this fax in error, please notify us immediately by telephone so that we can arrange for its return to [place]. Phone: [phone], Toll-Free: [phone], Fax: [phone] Page: 2 of 2 Call Id: [id number] [place] Disagree/Comply Comply Caller Understands Yes PreDisposition Call Doctor Care Advice Given Per Guideline SEE PCP WITHIN 3 DAYS: CALL BACK IF: * You become worse. CARE ADVICE given per Skin Lesion - Moles or Growths (Adult) guideline. Referrals REFERRED TO PCP OFFICE

## 2019-07-25 NOTE — Patient Instructions (Addendum)
Watch the spot on your toe Keep clean with soap and water Cover lightly if needed  Protect feet with a more sturdy shoe when working outsides   I think this is a bruise /remnant of blood blister If so -it should start fading   If bigger or no change in 2 weeks please call and let us know and I will do a dermatology referral

## 2019-10-03 ENCOUNTER — Other Ambulatory Visit: Payer: Self-pay | Admitting: Family Medicine

## 2019-10-03 NOTE — Telephone Encounter (Signed)
Please schedule PE and refill until then thanks

## 2019-10-03 NOTE — Telephone Encounter (Signed)
Pt has had a recent acute appt but no recent f/u or CPE and it's been over a year since TSH labs were checked, please advise

## 2019-10-04 NOTE — Telephone Encounter (Signed)
Pt scheduled appt with [facility], med filled

## 2019-11-03 ENCOUNTER — Telehealth: Payer: Self-pay

## 2019-11-03 NOTE — Telephone Encounter (Signed)
LVM w COVID screen, front door and back lab info 1.21.2021 TLJ  

## 2019-11-06 ENCOUNTER — Telehealth: Payer: Self-pay | Admitting: Family Medicine

## 2019-11-06 DIAGNOSIS — I10 Essential (primary) hypertension: Secondary | ICD-10-CM

## 2019-11-06 DIAGNOSIS — E7800 Pure hypercholesterolemia, unspecified: Secondary | ICD-10-CM

## 2019-11-06 DIAGNOSIS — E890 Postprocedural hypothyroidism: Secondary | ICD-10-CM

## 2019-11-06 NOTE — Telephone Encounter (Signed)
-----   Message from Aquilla Solian, RT sent at 10/28/2019  1:47 PM EST ----- Regarding: Lab Orders for Monday 1.25.2021 Please place lab orders for Monday 1.25.2021, office visit for physical on Thursday 1.28.2021 Thank you, Jones Bales RT(R)

## 2019-11-07 ENCOUNTER — Other Ambulatory Visit: Payer: Self-pay

## 2019-11-07 ENCOUNTER — Other Ambulatory Visit (INDEPENDENT_AMBULATORY_CARE_PROVIDER_SITE_OTHER): Payer: Managed Care, Other (non HMO)

## 2019-11-07 DIAGNOSIS — E7800 Pure hypercholesterolemia, unspecified: Secondary | ICD-10-CM

## 2019-11-07 DIAGNOSIS — E890 Postprocedural hypothyroidism: Secondary | ICD-10-CM | POA: Diagnosis not present

## 2019-11-07 DIAGNOSIS — I10 Essential (primary) hypertension: Secondary | ICD-10-CM | POA: Diagnosis not present

## 2019-11-07 LAB — CBC WITH DIFFERENTIAL/PLATELET
Basophils Absolute: 0 10*3/uL (ref 0.0–0.1)
Basophils Relative: 0.5 % (ref 0.0–3.0)
Eosinophils Absolute: 0.2 10*3/uL (ref 0.0–0.7)
Eosinophils Relative: 3.1 % (ref 0.0–5.0)
HCT: 37.5 % (ref 36.0–46.0)
Hemoglobin: 12.3 g/dL (ref 12.0–15.0)
Lymphocytes Relative: 34.6 % (ref 12.0–46.0)
Lymphs Abs: 2.7 10*3/uL (ref 0.7–4.0)
MCHC: 32.9 g/dL (ref 30.0–36.0)
MCV: 87.6 fl (ref 78.0–100.0)
Monocytes Absolute: 0.6 10*3/uL (ref 0.1–1.0)
Monocytes Relative: 7.8 % (ref 3.0–12.0)
Neutro Abs: 4.2 10*3/uL (ref 1.4–7.7)
Neutrophils Relative %: 54 % (ref 43.0–77.0)
Platelets: 264 10*3/uL (ref 150.0–400.0)
RBC: 4.27 Mil/uL (ref 3.87–5.11)
RDW: 13.7 % (ref 11.5–15.5)
WBC: 7.8 10*3/uL (ref 4.0–10.5)

## 2019-11-07 LAB — COMPREHENSIVE METABOLIC PANEL
ALT: 14 U/L (ref 0–35)
AST: 17 U/L (ref 0–37)
Albumin: 4.1 g/dL (ref 3.5–5.2)
Alkaline Phosphatase: 81 U/L (ref 39–117)
BUN: 15 mg/dL (ref 6–23)
CO2: 28 mEq/L (ref 19–32)
Calcium: 9.2 mg/dL (ref 8.4–10.5)
Chloride: 103 mEq/L (ref 96–112)
Creatinine, Ser: 0.75 mg/dL (ref 0.40–1.20)
GFR: 76.88 mL/min (ref >60.00)
Glucose, Bld: 97 mg/dL (ref 70–99)
Potassium: 4.3 mEq/L (ref 3.5–5.1)
Sodium: 137 mEq/L (ref 135–145)
Total Bilirubin: 0.4 mg/dL (ref 0.2–1.2)
Total Protein: 7 g/dL (ref 6.0–8.3)

## 2019-11-07 LAB — LIPID PANEL
Cholesterol: 227 mg/dL — ABNORMAL HIGH (ref 0–200)
HDL: 58.8 mg/dL (ref >39.00)
LDL Cholesterol: 150 mg/dL — ABNORMAL HIGH (ref 0–99)
NonHDL: 168.52
Total CHOL/HDL Ratio: 4
Triglycerides: 91 mg/dL (ref 0.0–149.0)
VLDL: 18.2 mg/dL (ref 0.0–40.0)

## 2019-11-07 LAB — TSH: TSH: 0.48 u[IU]/mL (ref 0.35–4.50)

## 2019-11-10 ENCOUNTER — Ambulatory Visit (INDEPENDENT_AMBULATORY_CARE_PROVIDER_SITE_OTHER): Payer: Managed Care, Other (non HMO) | Admitting: Family Medicine

## 2019-11-10 ENCOUNTER — Other Ambulatory Visit: Payer: Self-pay

## 2019-11-10 ENCOUNTER — Encounter: Payer: Self-pay | Admitting: Family Medicine

## 2019-11-10 VITALS — BP 138/82 | HR 69 | Temp 97.3°F | Ht 63.5 in | Wt 176.2 lb

## 2019-11-10 DIAGNOSIS — Z0000 Encounter for general adult medical examination without abnormal findings: Secondary | ICD-10-CM | POA: Diagnosis not present

## 2019-11-10 DIAGNOSIS — E7800 Pure hypercholesterolemia, unspecified: Secondary | ICD-10-CM

## 2019-11-10 DIAGNOSIS — I10 Essential (primary) hypertension: Secondary | ICD-10-CM | POA: Diagnosis not present

## 2019-11-10 DIAGNOSIS — M85859 Other specified disorders of bone density and structure, unspecified thigh: Secondary | ICD-10-CM | POA: Diagnosis not present

## 2019-11-10 DIAGNOSIS — Z6830 Body mass index (BMI) 30.0-30.9, adult: Secondary | ICD-10-CM

## 2019-11-10 DIAGNOSIS — E890 Postprocedural hypothyroidism: Secondary | ICD-10-CM

## 2019-11-10 DIAGNOSIS — R2232 Localized swelling, mass and lump, left upper limb: Secondary | ICD-10-CM

## 2019-11-10 DIAGNOSIS — E66811 Obesity, class 1: Secondary | ICD-10-CM

## 2019-11-10 DIAGNOSIS — E6609 Other obesity due to excess calories: Secondary | ICD-10-CM

## 2019-11-10 MED ORDER — LISINOPRIL 5 MG PO TABS: ORAL_TABLET | ORAL | 11 refills | Status: DC

## 2019-11-10 MED ORDER — LEVOTHYROXINE SODIUM 137 MCG PO TABS: ORAL_TABLET | ORAL | 11 refills | Status: DC

## 2019-11-10 NOTE — Assessment & Plan Note (Signed)
Reviewed health habits including diet and exercise and skin cancer prevention Reviewed appropriate screening tests for age  Also reviewed health mt list, fam hx and immunization status , as well as social and family history   See HPI Labs reviewed  Commended wt loss and mt  Enc continued exercise inst pt to schedule her mammogram-she plans to  Also to consider covid vaccine when available

## 2019-11-10 NOTE — Progress Notes (Signed)
Subjective:    Patient ID: Jodi Steele, female    DOB: [birth year], 69 y.o.   MRN: [id number]  This visit occurred during the SARS-CoV-2 public health emergency.  Safety protocols were in place, including screening questions prior to the visit, additional usage of staff PPE, and extensive cleaning of exam room while observing appropriate contact time as indicated for disinfecting solutions.    HPI Here for health maintenance exam and to review chronic medical problems  Health wise she feels ok  Aches and pains  Having shoulder problems - rom /hard to reach above her head    Wt Readings from Last 3 Encounters:  11/10/19 176 lb 3 oz (79.9 kg)  07/25/19 177 lb 6 oz (80.5 kg)  09/15/18 183 lb 4 oz (83.1 kg)  eating healthy -lost wt not eating out  30.72 kg/m Walks for exercise  Has resistance bands to use   Has a growth on L 3rd finger- raised / getting larger   Mammogram 1/20-has not set up yet  Self breast exam - no lumps or changes  Tdap 10/13  Colonoscopy 5/18  dexa 4/19-osteopenia of [place]-none  Fractures- none  Supplements - taking calcium and D Exercise - walking (10,000 steps daily)   pna vaccine -completed  zostavax 3/15   bp is stable today (better on 2nd check BP: 138/82 ) She gave blood recently and bp was fine  No cp or palpitations or headaches or edema  No side effects to medicines  BP Readings from Last 3 Encounters:  11/10/19 (!) 146/74  07/25/19 136/86  09/15/18 118/78     Hypothyroid with h/o surgery Hypothyroidism  Pt has no clinical changes No change in energy level/ hair or skin/ edema and no tremor Lab Results  Component Value Date   TSH 0.48 11/07/2019     Hyperlipidemia Lab Results  Component Value Date   CHOL 227 (H) 11/07/2019   CHOL 198 12/13/2018   CHOL 215 (H) 09/06/2018   Lab Results  Component Value Date   HDL 58.80 11/07/2019   HDL 58.30 12/13/2018   HDL 56.80 09/06/2018   Lab Results  Component Value  Date   LDLCALC 150 (H) 11/07/2019   LDLCALC 128 (H) 12/13/2018   LDLCALC 138 (H) 09/06/2018   Lab Results  Component Value Date   TRIG 91.0 11/07/2019   TRIG 62.0 12/13/2018   TRIG 102.0 09/06/2018   Lab Results  Component Value Date   CHOLHDL 4 11/07/2019   CHOLHDL 3 12/13/2018   CHOLHDL 4 09/06/2018   Lab Results  Component Value Date   LDLDIRECT 144.4 12/06/2013   LDLDIRECT 128.3 11/17/2011   LDLDIRECT 151.7 08/15/2011  LDL - is up significantly  Eating more dairy - yogurt however/non fat  Some beef  No fried food   She cooks with butter  Less eggs  Tomasa Blase every other week  Shrimp rarely   Lab Results  Component Value Date   CREATININE 0.75 11/07/2019   BUN 15 11/07/2019   NA 137 11/07/2019   K 4.3 11/07/2019   CL 103 11/07/2019   CO2 28 11/07/2019   Lab Results  Component Value Date   WBC 7.8 11/07/2019   HGB 12.3 11/07/2019   HCT 37.5 11/07/2019   MCV 87.6 11/07/2019   PLT 264.0 11/07/2019   Lab Results  Component Value Date   TSH 0.48 11/07/2019    Lab Results  Component Value Date   ALT 14 11/07/2019   AST 17  11/07/2019   ALKPHOS 81 11/07/2019   BILITOT 0.4 11/07/2019   Patient Active Problem List   Diagnosis Date Noted  . Lump on finger, left 11/10/2019  . Osteopenia 01/18/2018  . Estrogen deficiency 09/14/2017  . Colon cancer screening 06/27/2015  . Encounter for routine gynecological examination 12/10/2012  . Obesity 11/28/2011  . Other screening mammogram 08/22/2011  . Routine general medical examination at a health care facility 08/14/2011  . Hypothyroidism 09/04/2008  . PURE HYPERCHOLESTEROLEMIA 09/04/2008  . HYPERTENSION, BENIGN ESSENTIAL 04/20/2007  . DEPRESSION 04/07/2007  . ASTHMA 04/07/2007  . FIBROCYSTIC BREAST DISEASE 04/07/2007  . BUNION 04/07/2007   Past Medical History:  Diagnosis Date  . Asthma    related to pet/dog exposure  . Bunion   . Colon polyp   . Depression   . Diffuse cystic mastopathy   . Goiter,  unspecified    surgery  . HTN (hypertension)   . Hypothyroidism   . Pure hypercholesterolemia    Past Surgical History:  Procedure Laterality Date  . BREAST BIOPSY Right 1985   abcsess  . BREAST CYST ASPIRATION Left    negative  . COLONOSCOPY  11/10   polyp  . INCISION AND DRAINAGE BREAST ABSCESS     right  . THYROIDECTOMY    . TUBAL LIGATION     Social History   Tobacco Use  . Smoking status: Former Smoker    Quit date: 10/13/1988    Years since quitting: 31.0  . Smokeless tobacco: Never Used  Substance Use Topics  . Alcohol use: Yes    Alcohol/week: 0.0 standard drinks    Comment: daily usually wine  . Drug use: No   Family History  Problem Relation Age of Onset  . Other Father        Mesothelioma  . Thyroid cancer Mother        Anaplastic thyroid cancer  . Hypertension Mother   . Diabetes Mother   . Thyroid cancer Sister   . Diabetes Other        GF  . Breast cancer Neg Hx    No Known Allergies Current Outpatient Medications on File Prior to Visit  Medication Sig Dispense Refill  . albuterol (VENTOLIN HFA) 108 (90 Base) MCG/ACT inhaler inhale 2 puffs by mouth INTO THE LUNGS every 4 hours if needed for wheezing 18 g 5  . Calcium Carbonate (CALCIUM 600 PO) Take 1,200 mg by mouth daily.     . vitamin C (ASCORBIC ACID) 500 MG tablet Take 500 mg by mouth daily.      No current facility-administered medications on file prior to visit.     Review of Systems  Constitutional: Negative for activity change, appetite change, fatigue, fever and unexpected weight change.  HENT: Negative for congestion, ear pain, rhinorrhea, sinus pressure and sore throat.   Eyes: Negative for pain, redness and visual disturbance.  Respiratory: Negative for cough, shortness of breath and wheezing.   Cardiovascular: Negative for chest pain and palpitations.  Gastrointestinal: Negative for abdominal pain, blood in stool, constipation and diarrhea.  Endocrine: Negative for polydipsia and  polyuria.  Genitourinary: Negative for dysuria, frequency and urgency.  Musculoskeletal: Positive for arthralgias. Negative for back pain and myalgias.  Skin: Negative for pallor and rash.       Skin lesion on L middle finger  Allergic/Immunologic: Negative for environmental allergies.  Neurological: Negative for dizziness, syncope and headaches.  Hematological: Negative for adenopathy. Does not bruise/bleed easily.  Psychiatric/Behavioral: Negative for decreased concentration  and dysphoric mood. The patient is not nervous/anxious.        Objective:   Physical Exam Constitutional:      General: She is not in acute distress.    Appearance: Normal appearance. She is well-developed. She is obese. She is not ill-appearing or diaphoretic.  HENT:     Head: Normocephalic and atraumatic.     Right Ear: Tympanic membrane, ear canal and external ear normal.     Left Ear: Tympanic membrane, ear canal and external ear normal.     Nose: Nose normal. No congestion.     Mouth/Throat:     Mouth: Mucous membranes are moist.     Pharynx: Oropharynx is clear. No posterior oropharyngeal erythema.  Eyes:     General: No scleral icterus.    Extraocular Movements: Extraocular movements intact.     Conjunctiva/sclera: Conjunctivae normal.     Pupils: Pupils are equal, round, and reactive to light.  Neck:     Thyroid: No thyromegaly.     Vascular: No carotid bruit or JVD.  Cardiovascular:     Rate and Rhythm: Normal rate and regular rhythm.     Pulses: Normal pulses.     Heart sounds: Normal heart sounds. No gallop.   Pulmonary:     Effort: Pulmonary effort is normal. No respiratory distress.     Breath sounds: Normal breath sounds. No wheezing.     Comments: Good air exch Chest:     Chest wall: No tenderness.  Abdominal:     General: Bowel sounds are normal. There is no distension or abdominal bruit.     Palpations: Abdomen is soft. There is no mass.     Tenderness: There is no abdominal  tenderness.     Hernia: No hernia is present.  Genitourinary:    Comments: Breast exam: No mass, nodules, thickening, tenderness, bulging, retraction, inflamation, nipple discharge or skin changes noted.  No axillary or clavicular LA.     Musculoskeletal:        General: No tenderness. Normal range of motion.     Cervical back: Normal range of motion and neck supple. No rigidity. No muscular tenderness.     Right lower leg: No edema.     Left lower leg: No edema.  Lymphadenopathy:     Cervical: No cervical adenopathy.  Skin:    General: Skin is warm and dry.     Coloration: Skin is not pale.     Findings: No erythema or rash.     Comments: Solar lentigines diffusely  3-4 mm round raised smooth skin lesion on palmar side of L middle finger No color change  No tenderness    Neurological:     Mental Status: She is alert. Mental status is at baseline.     Cranial Nerves: No cranial nerve deficit.     Motor: No abnormal muscle tone.     Coordination: Coordination normal.     Gait: Gait normal.     Deep Tendon Reflexes: Reflexes are normal and symmetric. Reflexes normal.  Psychiatric:        Mood and Affect: Mood normal.        Cognition and Memory: Cognition and memory normal.           Assessment & Plan:   Problem List Items Addressed This Visit      Cardiovascular and Mediastinum   HYPERTENSION, BENIGN ESSENTIAL    bp in fair control at this time  BP Readings from Last 1  Encounters:  11/10/19 138/82   No changes needed Most recent labs reviewed  Disc lifstyle change with low sodium diet and exercise        Relevant Medications   lisinopril (ZESTRIL) 5 MG tablet     Endocrine   Hypothyroidism    Hypothyroidism  Pt has no clinical changes No change in energy level/ hair or skin/ edema and no tremor Lab Results  Component Value Date   TSH 0.48 11/07/2019          Relevant Medications   levothyroxine (SYNTHROID) 137 MCG tablet     Musculoskeletal and  Integument   Osteopenia    dexa 4/19  No falls or fx Taking ca and D Good exercise   Can re check April or later         Other   PURE HYPERCHOLESTEROLEMIA    Disc goals for lipids and reasons to control them Rev last labs with pt Rev low sat fat diet in detail  LDL is up to 150  Disc possible genetic cause (diet is fair)  Enc pt to consider statin (like low dose crestor) She is not open to it yet-will work on diet       Relevant Medications   lisinopril (ZESTRIL) 5 MG tablet   Routine general medical examination at a health care facility - Primary    Reviewed health habits including diet and exercise and skin cancer prevention Reviewed appropriate screening tests for age  Also reviewed health mt list, fam hx and immunization status , as well as social and family history   See HPI Labs reviewed  Commended wt loss and mt  Enc continued exercise inst pt to schedule her mammogram-she plans to  Also to consider covid vaccine when available        Obesity    Discussed how this problem influences overall health and the risks it imposes  Reviewed plan for weight loss with lower calorie diet (via better food choices and also portion control or program like weight watchers) and exercise building up to or more than 30 minutes 5 days per week including some aerobic activity         Lump on finger, left    Palmar side of 3rd finger- round and raised- resembles a smooth wart  Ref to dermatology Pt has tried various otc remedies w/o success It is bothersome      Relevant Orders   Ambulatory referral to Dermatology

## 2019-11-10 NOTE — Assessment & Plan Note (Signed)
Hypothyroidism  Pt has no clinical changes No change in energy level/ hair or skin/ edema and no tremor Lab Results  Component Value Date   TSH 0.48 11/07/2019

## 2019-11-10 NOTE — Assessment & Plan Note (Signed)
dexa 4/19  No falls or fx Taking ca and D Good exercise   Can re check April or later

## 2019-11-10 NOTE — Assessment & Plan Note (Signed)
Discussed how this problem influences overall health and the risks it imposes  Reviewed plan for weight loss with lower calorie diet (via better food choices and also portion control or program like weight watchers) and exercise building up to or more than 30 minutes 5 days per week including some aerobic activity    

## 2019-11-10 NOTE — Assessment & Plan Note (Signed)
Disc goals for lipids and reasons to control them Rev last labs with pt Rev low sat fat diet in detail  LDL is up to 150  Disc possible genetic cause (diet is fair)  Enc pt to consider statin (like low dose crestor) She is not open to it yet-will work on diet

## 2019-11-10 NOTE — Assessment & Plan Note (Signed)
Palmar side of 3rd finger- round and raised- resembles a smooth wart  Ref to dermatology Pt has tried various otc remedies w/o success It is bothersome

## 2019-11-10 NOTE — Patient Instructions (Addendum)
Please schedule your mammogram at [facility]   For cholesterol: Avoid red meat/ fried foods/ egg yolks/ fatty breakfast meats/ butter, cheese and high fat dairy/ and shellfish   She does not want to take a statin   Our office will call you about a dermatology referral for the lesion on your finger   If you want to try a statin for cholesterol - let us know  I would consider low dose generic crestor

## 2019-11-10 NOTE — Assessment & Plan Note (Signed)
bp in fair control at this time  BP Readings from Last 1 Encounters:  11/10/19 138/82   No changes needed Most recent labs reviewed  Disc lifstyle change with low sodium diet and exercise

## 2019-11-17 ENCOUNTER — Other Ambulatory Visit: Payer: Self-pay | Admitting: Family Medicine

## 2019-11-17 DIAGNOSIS — Z1231 Encounter for screening mammogram for malignant neoplasm of breast: Secondary | ICD-10-CM

## 2019-12-05 ENCOUNTER — Ambulatory Visit: Payer: Managed Care, Other (non HMO) | Attending: Internal Medicine

## 2019-12-05 DIAGNOSIS — Z23 Encounter for immunization: Secondary | ICD-10-CM | POA: Insufficient documentation

## 2019-12-05 NOTE — Progress Notes (Signed)
   Covid-19 Vaccination Clinic  Name:  Jodi Steele    MRN: [id number] DOB: 09/02/1951  12/05/2019  Jodi Steele was observed post Covid-19 immunization for 15 minutes without incidence. She was provided with Vaccine Information Sheet and instruction to access the V-Safe system.   Jodi Steele was instructed to call 911 with any severe reactions post vaccine: Marland Kitchen Difficulty breathing  . Swelling of your face and throat  . A fast heartbeat  . A bad rash all over your body  . Dizziness and weakness    Immunizations Administered    Name Date Dose VIS Date Route   Moderna COVID-19 Vaccine 12/05/2019 12:26 PM 0.5 mL 09/13/2019 Intramuscular   Manufacturer: Moderna   Lot: [id number]   NDC: 62952-841-32

## 2019-12-16 ENCOUNTER — Ambulatory Visit
Admission: RE | Admit: 2019-12-16 | Discharge: 2019-12-16 | Disposition: A | Discharge status: Home / Self Care | Payer: Managed Care, Other (non HMO) | Source: Ambulatory Visit | Attending: Family Medicine | Admitting: Family Medicine

## 2019-12-16 DIAGNOSIS — Z1231 Encounter for screening mammogram for malignant neoplasm of breast: Secondary | ICD-10-CM | POA: Insufficient documentation

## 2020-01-03 ENCOUNTER — Ambulatory Visit: Payer: Managed Care, Other (non HMO) | Attending: Internal Medicine

## 2020-01-03 DIAGNOSIS — Z23 Encounter for immunization: Secondary | ICD-10-CM

## 2020-01-03 NOTE — Progress Notes (Signed)
   Covid-19 Vaccination Clinic  Name:  Jodi Steele    MRN: [id number] DOB: 01-16-1951  01/03/2020  [name] was observed post Covid-19 immunization for 15 minutes without incident. She was provided with Vaccine Information Sheet and instruction to access the V-Safe system.   [name] was instructed to call 911 with any severe reactions post vaccine: Marland Kitchen Difficulty breathing  . Swelling of face and throat  . A fast heartbeat  . A bad rash all over body  . Dizziness and weakness   Immunizations Administered    Name Date Dose VIS Date Route   Moderna COVID-19 Vaccine 01/03/2020  2:02 PM 0.5 mL 09/13/2019 Intramuscular   Manufacturer: Gala Murdoch   Lot: [id number]   NDC: 30131-438-88

## 2020-02-09 ENCOUNTER — Ambulatory Visit: Payer: Self-pay | Admitting: Dermatology

## 2020-04-23 ENCOUNTER — Ambulatory Visit: Payer: Managed Care, Other (non HMO) | Admitting: Family Medicine

## 2020-04-30 ENCOUNTER — Ambulatory Visit (INDEPENDENT_AMBULATORY_CARE_PROVIDER_SITE_OTHER)
Admission: RE | Admit: 2020-04-30 | Discharge: 2020-04-30 | Disposition: A | Discharge status: Home / Self Care | Payer: Managed Care, Other (non HMO) | Source: Ambulatory Visit | Attending: Family Medicine | Admitting: Family Medicine

## 2020-04-30 ENCOUNTER — Ambulatory Visit: Payer: Managed Care, Other (non HMO) | Admitting: Family Medicine

## 2020-04-30 ENCOUNTER — Encounter: Payer: Self-pay | Admitting: Family Medicine

## 2020-04-30 ENCOUNTER — Other Ambulatory Visit: Payer: Self-pay

## 2020-04-30 VITALS — BP 130/90 | HR 69 | Temp 97.2°F | Ht 63.5 in | Wt 176.0 lb

## 2020-04-30 DIAGNOSIS — M25512 Pain in left shoulder: Secondary | ICD-10-CM | POA: Diagnosis not present

## 2020-04-30 DIAGNOSIS — M19011 Primary osteoarthritis, right shoulder: Secondary | ICD-10-CM | POA: Diagnosis not present

## 2020-04-30 DIAGNOSIS — S4291XS Fracture of right shoulder girdle, part unspecified, sequela: Secondary | ICD-10-CM | POA: Diagnosis not present

## 2020-04-30 DIAGNOSIS — M19012 Primary osteoarthritis, left shoulder: Secondary | ICD-10-CM

## 2020-04-30 DIAGNOSIS — G8929 Other chronic pain: Secondary | ICD-10-CM | POA: Diagnosis not present

## 2020-04-30 DIAGNOSIS — M25511 Pain in right shoulder: Secondary | ICD-10-CM

## 2020-04-30 NOTE — Progress Notes (Signed)
Ozro Russett T. Murlean Seelye, MD, CAQ Sports Medicine  Primary Care and Sports Medicine [facility] at [facility] [address] [place], [address]  Phone: [phone]   FAX: [phone]  Jodi Steele - 69 y.o. female   MRN [id number]   Date of Birth: 16-Feb-1951  Date: 04/30/2020   PCP: Judy Pimple, MD   Referral: Judy Pimple, MD  Chief Complaint  Patient presents with   Shoulder Pain    Bilateral    This visit occurred during the SARS-CoV-2 public health emergency.  Safety protocols were in place, including screening questions prior to the visit, additional usage of staff PPE, and extensive cleaning of exam room while observing appropriate contact time as indicated for disinfecting solutions.   Subjective:   Jodi Steele is a 69 y.o. very pleasant female patient with Body mass index is 30.69 kg/m. who presents with the following:  Complicated shoulder history where she has extensive loss of motion bilaterally.  Pain daily, pain at nighttime, pain with terminal motion.  She is limited somewhat functionally with the loss of her motion and chronic pain.  By history it sounds like she did have a fracture dislocation on her right shoulder approximately 7 years ago.  This was treated conservatively with her being in a sling for some amount of time.  Afterwards, she did do some formal physical therapy, but she has continued to have some pain since that time and a permanent loss of motion.  She is fairly active and does a lot of yard work and things around the house.  She has difficulty now putting on her pants as well as doing her hair and washing her hair.  She does not describe any numbness or tingling.  She does have pain in the left shoulder as well, but it is not as bad.  Both of her shoulder pain has progressed over time.  Currently she is at increased amounts of pain.   R shoulder dislocation 7 days ago.  Did some PT and still has had some  pain.   Permanent loss of motion.  Pants and hair.  A lot of yard work.  R shoulder has progressed over years with loss of motion.   R > L    Review of Systems is noted in the HPI, as appropriate   Objective:   BP 130/90    Pulse 69    Temp (!) 97.2 F (36.2 C) (Temporal)    Ht 5' 3.5" (1.613 m)    Wt 176 lb (79.8 kg)    SpO2 98%    BMI 30.69 kg/m   GEN: No acute distress; alert,appropriate. PULM: Breathing comfortably in no respiratory distress PSYCH: Normally interactive.    Bilateral: Shoulder: Extensive loss of motion with flexion and abduction to approximately 100 degrees.  Pain with terminal motion.  Approximate 5 to 10 degrees of rotational movement in both shoulders.  This is at the abduction to approximately 80 degrees.  Nontender along the clavicle.  Mildly tender at the [facility] joint and in the bicipital groove.  Other special testing is limited secondary to loss of motion  Radiology: DG Shoulder Right  Result Date: 04/30/2020 CLINICAL DATA:  Bilateral shoulder pain loss of motion EXAM: RIGHT SHOULDER - 2+ VIEW COMPARISON:  10/06/2014 FINDINGS: Mild AC joint degenerative change. High-riding appearance of the humeral head. Chronic appearing fracture deformity of the humeral head and neck, including large old Hill-Sachs deformity. Advanced arthritis of the glenohumeral  joint with narrowing, sclerosis, and bulky inferior osteophyte. No femoral head collapse or fragmentation. IMPRESSION: Chronic appearing fracture deformity of the humeral head and neck with advanced arthritis of the glenohumeral joint. High-riding appearance of the humeral head, suggestive of rotator cuff pathology. Electronically Signed   By: Jasmine Pang M.D.   On: 04/30/2020 15:22   DG Shoulder Left  Result Date: 04/30/2020 CLINICAL DATA:  Left shoulder pain loss of motion EXAM: LEFT SHOULDER - 2+ VIEW COMPARISON:  None. FINDINGS: No fracture or dislocation is evident. Moderate AC joint degenerative change.  Moderate advanced glenohumeral degenerative changes with narrowing and sclerosis. Pedunculated osteophyte versus loose body along the inferior joint. Aortic atherosclerosis. IMPRESSION: No acute osseous abnormality. Moderate to advanced glenohumeral degenerative changes and moderate AC joint degenerative change. Pedunculated spur versus calcified loose body along the inferior joint. Electronically Signed   By: Jasmine Pang M.D.   On: 04/30/2020 15:12     Assessment and Plan:     ICD-10-CM   1. End stage glenohumeral arthritis  M19.011    M19.012   2. Chronic pain of both shoulders  M25.511 DG Shoulder Left   G89.29 DG Shoulder Right   M25.512   3. Fracture dislocation of joint of right shoulder girdle, sequela  S42.91XS    Total encounter time: 30 minutes. This includes total time spent on the day of encounter.  Record review, review of prior x-rays from time of her prior shoulder injury 7 years ago.  Face-to-face review of the patient with her current end-stage arthritic shoulder x-rays, and I did go over these as well as potential options and anatomical explanation.  Frankly, she has one of the worst right-sided shoulder x-rays I have seen in some time.  Obvious prior fracture of the humeral head and neck.  Extensive, very large osteophytosis with a very high riding humeral head.  Almost certainly, she has a prior rotator cuff tear.  Left shoulder is also moderate to severe glenohumeral arthritis, to a lesser degree compared to the right.  She is interested in doing some basic rehab, so I reviewed her throwers 10 program with her.  I do not think anything would provide her longstanding relief with her right shoulder short of the total shoulder arthroplasty.  At this point she is not interested in discussing this with one of the shoulder surgeons  Follow-up: No follow-ups on file.  No orders of the defined types were placed in this encounter.  There are no discontinued  medications. Orders Placed This Encounter  Procedures   DG Shoulder Left   DG Shoulder Right    Signed,  Rae Plotner T. Dayvian Blixt, MD   Outpatient Encounter Medications as of 04/30/2020  Medication Sig   albuterol (VENTOLIN HFA) 108 (90 Base) MCG/ACT inhaler inhale 2 puffs by mouth INTO THE LUNGS every 4 hours if needed for wheezing   Calcium Carbonate (CALCIUM 600 PO) Take 1,200 mg by mouth daily.    levothyroxine (SYNTHROID) 137 MCG tablet TAKE 1 TABLET BY MOUTH EVERY DAY AT LEAST ONE HR BEFORE BREAKFAST   lisinopril (ZESTRIL) 5 MG tablet TAKE 1 TABLET(5 MG) BY MOUTH DAILY   vitamin C (ASCORBIC ACID) 500 MG tablet Take 500 mg by mouth daily.    No facility-administered encounter medications on file as of 04/30/2020.

## 2020-04-30 NOTE — Patient Instructions (Signed)
Alleve 2 tabs by mouth two times a day over the counter: Take at least for 2 - 3 weeks. This is equal to a prescripton strength dose (GENERIC CHEAPER EQUIVALENT IS NAPROXEN SODIUM)    

## 2020-05-01 ENCOUNTER — Encounter: Payer: Self-pay | Admitting: Family Medicine

## 2020-05-03 ENCOUNTER — Telehealth: Payer: Self-pay

## 2020-05-03 NOTE — Telephone Encounter (Signed)
Pt notified of Dr. Patsy Lager and Dr. Royden Purl comments regarding her questions. Pt said she understands the Aortic atherosclerosis better and now that she knows what it is she is still sticking with her decision of not trying a statin. Pt said she will try her best to watch her diet and thanked both doctors for answering her questions. Nothing further needed

## 2020-05-03 NOTE — Telephone Encounter (Signed)
LMOM, but can you call her again later. (I am leaving shortly)  We went over shoulder x-rays together face to face in great detail. We talked about all of this.  Your right shoulder x-ray is one of the worst I have seen in years.  You have exceptionally bad arthritis - those words are describing this.  You can clearly see your old fracture. A hill-sachs injury occurs with shoulder dislocation. This is very minor compared to the massive joint changes from arthritis and trauma.  The very abnormal position higher position of the humeral head very often means a old rotator cuff tear - this can happen during dislocation or fracture that you had, but this is in no way new.  Nothing short of a total shoulder replacement could address those issues.  You also have advanced left sided shoulder arthritis. I still do not see calcification in your aorta - it would have to be very minor.  It is not my role to manage this, but I will alert Dr. Milinda Antis.

## 2020-05-03 NOTE — Telephone Encounter (Signed)
Pt read xray results online and is questioning the finding of Aortic Atherosclerosis on the left shoulder film.   Asking what pathology means in the right shoulder film.   She is asking why these things were not addressed.  Asked for a call back at [phone].

## 2020-05-03 NOTE — Telephone Encounter (Signed)
Shapale will relay message from Dr. Patsy Lager as well when she speaks to Ms. Creveling.  I don't want to bombard her with multiple phone calls.

## 2020-05-03 NOTE — Telephone Encounter (Signed)
Left VM requesting pt to call the office back 

## 2020-05-03 NOTE — Telephone Encounter (Signed)
Dr. Milinda Antis:  I wanted to send you a note.  The worst R shoulder x-ray, prior trauma, arthritis that I have seen in a long time.  We spoke at length yesterday face to face, but it looks like she did not completely get the radiologists words describing film from "result release."  FYI - "aortic atherosclerosis" seen on L film.  I cannot see it, but the patient was concerned.  I am not sure how you would want to manage.  I really would not do anything except make sure she has good BP and cholesterol.

## 2020-05-03 NOTE — Telephone Encounter (Signed)
Aortic atherosclerosis was an incidental finding/ mild and not uncommon (it means some cholesterol plaque in the aorta) The best way to prevent problems and progression is to make sure that BP and cholesterol is well controlled.   In the past she has not been ready to start cholesterol medicine but it may help.  If she wants to try a low dose statin at any time please let me know

## 2020-05-03 NOTE — Telephone Encounter (Signed)
Patient called office back stating she received a call from Shapale and would like her to give her a call back at [phone].

## 2020-06-03 ENCOUNTER — Other Ambulatory Visit: Payer: Self-pay | Admitting: Family Medicine

## 2020-07-04 ENCOUNTER — Telehealth: Payer: Self-pay | Admitting: Family Medicine

## 2020-07-04 NOTE — Telephone Encounter (Signed)
Form in your inbox. CPE was on 11/10/19 and last labs 11/07/19

## 2020-07-04 NOTE — Telephone Encounter (Signed)
Patient came into office and dropped off wellness screening form to be filled out. Please advise. Placed in tower.

## 2020-07-05 NOTE — Telephone Encounter (Signed)
In IN box I do not have a waist circumference

## 2020-07-06 NOTE — Telephone Encounter (Signed)
Pt said she checked with them and they said it's okay to leave it blank, pt will pick up form

## 2020-11-15 LAB — HM COLONOSCOPY

## 2020-11-22 ENCOUNTER — Telehealth (INDEPENDENT_AMBULATORY_CARE_PROVIDER_SITE_OTHER): Payer: Managed Care, Other (non HMO) | Admitting: Family Medicine

## 2020-11-22 ENCOUNTER — Other Ambulatory Visit: Payer: Managed Care, Other (non HMO)

## 2020-11-22 DIAGNOSIS — E7800 Pure hypercholesterolemia, unspecified: Secondary | ICD-10-CM | POA: Diagnosis not present

## 2020-11-22 DIAGNOSIS — I10 Essential (primary) hypertension: Secondary | ICD-10-CM | POA: Diagnosis not present

## 2020-11-22 DIAGNOSIS — E890 Postprocedural hypothyroidism: Secondary | ICD-10-CM

## 2020-11-22 NOTE — Telephone Encounter (Signed)
-----   Message from Aquilla Solian, RT sent at 11/12/2020  4:44 PM EST ----- Regarding: Lab Orders for Friday 2.11.2022 Please place lab orders for Friday 2.11.2022, office visit for physical on Thursday 2.17.2022 Thank you, Jones Bales RT(R)

## 2020-11-23 ENCOUNTER — Other Ambulatory Visit: Payer: Self-pay

## 2020-11-23 ENCOUNTER — Other Ambulatory Visit (INDEPENDENT_AMBULATORY_CARE_PROVIDER_SITE_OTHER): Payer: Managed Care, Other (non HMO)

## 2020-11-23 DIAGNOSIS — I10 Essential (primary) hypertension: Secondary | ICD-10-CM | POA: Diagnosis not present

## 2020-11-23 LAB — CBC WITH DIFFERENTIAL/PLATELET
Basophils Absolute: 0 10*3/uL (ref 0.0–0.1)
Basophils Relative: 0.5 % (ref 0.0–3.0)
Eosinophils Absolute: 0.2 10*3/uL (ref 0.0–0.7)
Eosinophils Relative: 3.6 % (ref 0.0–5.0)
HCT: 40.1 % (ref 36.0–46.0)
Hemoglobin: 13.3 g/dL (ref 12.0–15.0)
Lymphocytes Relative: 36 % (ref 12.0–46.0)
Lymphs Abs: 2.5 10*3/uL (ref 0.7–4.0)
MCHC: 33.2 g/dL (ref 30.0–36.0)
MCV: 87 fl (ref 78.0–100.0)
Monocytes Absolute: 0.6 10*3/uL (ref 0.1–1.0)
Monocytes Relative: 8.3 % (ref 3.0–12.0)
Neutro Abs: 3.6 10*3/uL (ref 1.4–7.7)
Neutrophils Relative %: 51.6 % (ref 43.0–77.0)
Platelets: 237 10*3/uL (ref 150.0–400.0)
RBC: 4.61 Mil/uL (ref 3.87–5.11)
RDW: 13.7 % (ref 11.5–15.5)
WBC: 6.9 10*3/uL (ref 4.0–10.5)

## 2020-11-23 LAB — COMPREHENSIVE METABOLIC PANEL
ALT: 17 U/L (ref 0–35)
AST: 17 U/L (ref 0–37)
Albumin: 4.3 g/dL (ref 3.5–5.2)
Alkaline Phosphatase: 84 U/L (ref 39–117)
BUN: 19 mg/dL (ref 6–23)
CO2: 30 mEq/L (ref 19–32)
Calcium: 9.7 mg/dL (ref 8.4–10.5)
Chloride: 101 mEq/L (ref 96–112)
Creatinine, Ser: 0.75 mg/dL (ref 0.40–1.20)
GFR: 81.54 mL/min (ref >60.00)
Glucose, Bld: 89 mg/dL (ref 70–99)
Potassium: 4.5 mEq/L (ref 3.5–5.1)
Sodium: 139 mEq/L (ref 135–145)
Total Bilirubin: 0.4 mg/dL (ref 0.2–1.2)
Total Protein: 7.1 g/dL (ref 6.0–8.3)

## 2020-11-23 LAB — LIPID PANEL
Cholesterol: 240 mg/dL — ABNORMAL HIGH (ref 0–200)
HDL: 58.7 mg/dL (ref >39.00)
LDL Cholesterol: 159 mg/dL — ABNORMAL HIGH (ref 0–99)
NonHDL: 181.31
Total CHOL/HDL Ratio: 4
Triglycerides: 114 mg/dL (ref 0.0–149.0)
VLDL: 22.8 mg/dL (ref 0.0–40.0)

## 2020-11-23 LAB — TSH: TSH: 0.46 u[IU]/mL (ref 0.35–4.50)

## 2020-11-29 ENCOUNTER — Encounter: Payer: Self-pay | Admitting: Family Medicine

## 2020-11-29 ENCOUNTER — Ambulatory Visit (INDEPENDENT_AMBULATORY_CARE_PROVIDER_SITE_OTHER): Payer: Managed Care, Other (non HMO) | Admitting: Family Medicine

## 2020-11-29 ENCOUNTER — Other Ambulatory Visit: Payer: Self-pay

## 2020-11-29 VITALS — BP 132/84 | HR 63 | Temp 97.2°F | Ht 63.5 in | Wt 178.1 lb

## 2020-11-29 DIAGNOSIS — Z6831 Body mass index (BMI) 31.0-31.9, adult: Secondary | ICD-10-CM

## 2020-11-29 DIAGNOSIS — E890 Postprocedural hypothyroidism: Secondary | ICD-10-CM | POA: Diagnosis not present

## 2020-11-29 DIAGNOSIS — E7800 Pure hypercholesterolemia, unspecified: Secondary | ICD-10-CM

## 2020-11-29 DIAGNOSIS — E6609 Other obesity due to excess calories: Secondary | ICD-10-CM

## 2020-11-29 DIAGNOSIS — I10 Essential (primary) hypertension: Secondary | ICD-10-CM | POA: Diagnosis not present

## 2020-11-29 DIAGNOSIS — Z0000 Encounter for general adult medical examination without abnormal findings: Secondary | ICD-10-CM | POA: Diagnosis not present

## 2020-11-29 DIAGNOSIS — M85859 Other specified disorders of bone density and structure, unspecified thigh: Secondary | ICD-10-CM | POA: Diagnosis not present

## 2020-11-29 DIAGNOSIS — E2839 Other primary ovarian failure: Secondary | ICD-10-CM

## 2020-11-29 MED ORDER — LEVOTHYROXINE SODIUM 137 MCG PO TABS: ORAL_TABLET | ORAL | 11 refills | Status: DC

## 2020-11-29 MED ORDER — LISINOPRIL 5 MG PO TABS: ORAL_TABLET | ORAL | 11 refills | Status: DC

## 2020-11-29 NOTE — Assessment & Plan Note (Signed)
Discussed how this problem influences overall health and the risks it imposes  Reviewed plan for weight loss with lower calorie diet (via better food choices and also portion control or program like weight watchers) and exercise building up to or more than 30 minutes 5 days per week including some aerobic activity    

## 2020-11-29 NOTE — Patient Instructions (Addendum)
If you are interested in the new shingles vaccine (Shingrix) - call your local pharmacy to check on coverage and availability  If affordable, get on a wait list at your pharmacy to get the vaccine.  Have your pharmacy call us with the name of the albuterol that is covered   For cholesterol  Avoid red meat/ fried foods/ egg yolks/ fatty breakfast meats/ butter, cheese and high fat dairy/ and shellfish    Take care of yourself   Read about rosuvastatin  We can consider 5 mg daily or 5 mg twice per week  See what you think and let me know

## 2020-11-29 NOTE — Assessment & Plan Note (Signed)
bp in fair control at this time  BP Readings from Last 1 Encounters:  11/29/20 132/84   No changes needed Most recent labs reviewed  Disc lifstyle change with low sodium diet and exercise  Plan to continue lisinopril 5 mg daily

## 2020-11-29 NOTE — Assessment & Plan Note (Signed)
Disc goals for lipids and reasons to control them Rev last labs with pt Rev low sat fat diet in detail Pt declines statin thus far-discussed pros/cons Proposed crestor 5 mg daily or twice weekly  Handout given for her to review and get back to [place] if she wants to try it

## 2020-11-29 NOTE — Assessment & Plan Note (Signed)
Hypothyroidism  Pt has no clinical changes No change in energy level/ hair or skin/ edema and no tremor Lab Results  Component Value Date   TSH 0.46 11/23/2020    Plan to continue levothyroxine 137 mcg daily

## 2020-11-29 NOTE — Assessment & Plan Note (Signed)
Reviewed health habits including diet and exercise and skin cancer prevention Reviewed appropriate screening tests for age  Also reviewed health mt list, fam hx and immunization status , as well as social and family history   See HPI Labs reviewed covid immunized  Interested in shingrix if covered  Recent colonoscopy- 5 y recall

## 2020-11-29 NOTE — Assessment & Plan Note (Signed)
dexa ordered  No falls or fractures  Taking ca and D

## 2020-11-29 NOTE — Progress Notes (Signed)
Subjective:    Patient ID: Jodi Steele, female    DOB: 1951/06/28, 70 y.o.   MRN: [id number]  This visit occurred during the SARS-CoV-2 public health emergency.  Safety protocols were in place, including screening questions prior to the visit, additional usage of staff PPE, and extensive cleaning of exam room while observing appropriate contact time as indicated for disinfecting solutions.    HPI Here for health maintenance exam and to review chronic medical problems  Wt Readings from Last 3 Encounters:  11/29/20 178 lb 1 oz (80.8 kg)  04/30/20 176 lb (79.8 kg)  11/10/19 176 lb 3 oz (79.9 kg)   31.05 kg/m   Has been working  Thinking about retirement   Feeling pretty good overall     covid status -immunized with booster Flu shot 9/21 Tdap 10/13 pna vaccines utd zostavax 3/15  Colonoscopy 11/15/20 - small polyp and tic  5 year recall    Mammogram 3/21 Self breast exam -no lumps    dexa 4/19 -osteopenia  No falls  Did bump her head sledding -still has a bump  No fractures  Taking ca and D Exercise- regular walking/yard work    HTN bp is stable today  No cp or palpitations or headaches or edema  No side effects to medicines  BP Readings from Last 3 Encounters:  11/29/20 132/84  04/30/20 130/90  11/10/19 138/82     Takes lisinopril 5 mg daily   Pulse Readings from Last 3 Encounters:  11/29/20 63  04/30/20 69  11/10/19 69     Hypothyroidism  Pt has no clinical changes No change in energy level/ hair or skin/ edema and no tremor Lab Results  Component Value Date   TSH 0.46 11/23/2020     Taking levothyroxine 137 mcg daily   Hyperlipidemia Lab Results  Component Value Date   CHOL 240 (H) 11/23/2020   CHOL 227 (H) 11/07/2019   CHOL 198 12/13/2018   Lab Results  Component Value Date   HDL 58.70 11/23/2020   HDL 58.80 11/07/2019   HDL 58.30 12/13/2018   Lab Results  Component Value Date   LDLCALC 159 (H) 11/23/2020   LDLCALC 150  (H) 11/07/2019   LDLCALC 128 (H) 12/13/2018   Lab Results  Component Value Date   TRIG 114.0 11/23/2020   TRIG 91.0 11/07/2019   TRIG 62.0 12/13/2018   Lab Results  Component Value Date   CHOLHDL 4 11/23/2020   CHOLHDL 4 11/07/2019   CHOLHDL 3 12/13/2018   Lab Results  Component Value Date   LDLDIRECT 144.4 12/06/2013   LDLDIRECT 128.3 11/17/2011   LDLDIRECT 151.7 08/15/2011  ate poorly over the holidays  Declines statin drug     Still has a wart on L 3rd finger  Was tx by derm/ a shot and it did not work  Has a harder bump on the other side of her finger Neither of them hurt   A lot of shoulder problem   Other labs Results for orders placed or performed in visit on 11/22/20  CBC with Differential/Platelet  Result Value Ref Range   WBC 6.9 4.0 - 10.5 K/uL   RBC 4.61 3.87 - 5.11 Mil/uL   Hemoglobin 13.3 12.0 - 15.0 g/dL   HCT 50.9 32.6 - 71.2 %   MCV 87.0 78.0 - 100.0 fl   MCHC 33.2 30.0 - 36.0 g/dL   RDW 45.8 09.9 - 83.3 %   Platelets 237.0 150.0 - 400.0 K/uL  Neutrophils Relative % 51.6 43.0 - 77.0 %   Lymphocytes Relative 36.0 12.0 - 46.0 %   Monocytes Relative 8.3 3.0 - 12.0 %   Eosinophils Relative 3.6 0.0 - 5.0 %   Basophils Relative 0.5 0.0 - 3.0 %   Neutro Abs 3.6 1.4 - 7.7 K/uL   Lymphs Abs 2.5 0.7 - 4.0 K/uL   Monocytes Absolute 0.6 0.1 - 1.0 K/uL   Eosinophils Absolute 0.2 0.0 - 0.7 K/uL   Basophils Absolute 0.0 0.0 - 0.1 K/uL  Comprehensive metabolic panel  Result Value Ref Range   Sodium 139 135 - 145 mEq/L   Potassium 4.5 3.5 - 5.1 mEq/L   Chloride 101 96 - 112 mEq/L   CO2 30 19 - 32 mEq/L   Glucose, Bld 89 70 - 99 mg/dL   BUN 19 6 - 23 mg/dL   Creatinine, Ser 8.290.75 0.40 - 1.20 mg/dL   Total Bilirubin 0.4 0.2 - 1.2 mg/dL   Alkaline Phosphatase 84 39 - 117 U/L   AST 17 0 - 37 U/L   ALT 17 0 - 35 U/L   Total Protein 7.1 6.0 - 8.3 g/dL   Albumin 4.3 3.5 - 5.2 g/dL   GFR 56.2181.54 >30.86>60.00 mL/min   Calcium 9.7 8.4 - 10.5 mg/dL  TSH  Result  Value Ref Range   TSH 0.46 0.35 - 4.50 uIU/mL  Lipid panel  Result Value Ref Range   Cholesterol 240 (H) 0 - 200 mg/dL   Triglycerides 578.4114.0 0.0 - 149.0 mg/dL   HDL 69.6258.70 >95.28>39.00 mg/dL   VLDL 41.322.8 0.0 - 24.440.0 mg/dL   LDL Cholesterol 010159 (H) 0 - 99 mg/dL   Total CHOL/HDL Ratio 4    NonHDL 181.31     Patient Active Problem List   Diagnosis Date Noted  . Lump on finger, left 11/10/2019  . Osteopenia 01/18/2018  . Estrogen deficiency 09/14/2017  . Colon cancer screening 06/27/2015  . Encounter for routine gynecological examination 12/10/2012  . Obesity 11/28/2011  . Other screening mammogram 08/22/2011  . Routine general medical examination at a health care facility 08/14/2011  . Hypothyroidism 09/04/2008  . PURE HYPERCHOLESTEROLEMIA 09/04/2008  . HYPERTENSION, BENIGN ESSENTIAL 04/20/2007  . DEPRESSION 04/07/2007  . ASTHMA 04/07/2007  . FIBROCYSTIC BREAST DISEASE 04/07/2007  . BUNION 04/07/2007   Past Medical History:  Diagnosis Date  . Asthma    related to pet/dog exposure  . Bunion   . Colon polyp   . Depression   . Diffuse cystic mastopathy   . Goiter, unspecified    surgery  . HTN (hypertension)   . Hypothyroidism   . Pure hypercholesterolemia    Past Surgical History:  Procedure Laterality Date  . BREAST BIOPSY Right 1985   abcsess  . BREAST CYST ASPIRATION Left    negative  . COLONOSCOPY  11/10   polyp  . INCISION AND DRAINAGE BREAST ABSCESS     right  . THYROIDECTOMY    . TUBAL LIGATION     Social History   Tobacco Use  . Smoking status: Former Smoker    Quit date: 10/13/1988    Years since quitting: 32.1  . Smokeless tobacco: Never Used  Substance Use Topics  . Alcohol use: Yes    Alcohol/week: 0.0 standard drinks    Comment: daily usually wine  . Drug use: No   Family History  Problem Relation Age of Onset  . Other Father        Mesothelioma  . Thyroid cancer  Mother        Anaplastic thyroid cancer  . Hypertension Mother   . Diabetes  Mother   . Thyroid cancer Sister   . Diabetes Other        GF  . Breast cancer Neg Hx    No Known Allergies Current Outpatient Medications on File Prior to Visit  Medication Sig Dispense Refill  . albuterol (VENTOLIN HFA) 108 (90 Base) MCG/ACT inhaler INHALE 2 PUFFS BY MOUTH EVERY 4 HOURS AS NEEDED 18 g 2  . Ascorbic Acid (VITAMIN C) 1000 MG tablet Take 1,000 mg by mouth daily.    . Calcium Carbonate (CALCIUM 600 PO) Take 1,200 mg by mouth daily.     No current facility-administered medications on file prior to visit.    Review of Systems  Constitutional: Negative for activity change, appetite change, fatigue, fever and unexpected weight change.  HENT: Negative for congestion, ear pain, rhinorrhea, sinus pressure and sore throat.   Eyes: Negative for pain, redness and visual disturbance.  Respiratory: Negative for cough, shortness of breath and wheezing.   Cardiovascular: Negative for chest pain and palpitations.  Gastrointestinal: Negative for abdominal pain, blood in stool, constipation and diarrhea.  Endocrine: Negative for polydipsia and polyuria.  Genitourinary: Negative for dysuria, frequency and urgency.  Musculoskeletal: Negative for arthralgias, back pain and myalgias.  Skin: Negative for pallor and rash.       Wart on hand  Allergic/Immunologic: Negative for environmental allergies.  Neurological: Negative for dizziness, syncope and headaches.  Hematological: Negative for adenopathy. Does not bruise/bleed easily.  Psychiatric/Behavioral: Negative for decreased concentration and dysphoric mood. The patient is not nervous/anxious.        Objective:   Physical Exam Constitutional:      General: She is not in acute distress.    Appearance: Normal appearance. She is well-developed. She is obese. She is not ill-appearing or diaphoretic.  HENT:     Head: Normocephalic and atraumatic.     Right Ear: Tympanic membrane, ear canal and external ear normal.     Left Ear:  Tympanic membrane, ear canal and external ear normal.     Nose: Nose normal. No congestion.     Mouth/Throat:     Mouth: Mucous membranes are moist.     Pharynx: Oropharynx is clear. No posterior oropharyngeal erythema.  Eyes:     General: No scleral icterus.    Extraocular Movements: Extraocular movements intact.     Conjunctiva/sclera: Conjunctivae normal.     Pupils: Pupils are equal, round, and reactive to light.  Neck:     Thyroid: No thyromegaly.     Vascular: No carotid bruit or JVD.  Cardiovascular:     Rate and Rhythm: Normal rate and regular rhythm.     Pulses: Normal pulses.     Heart sounds: Normal heart sounds. No gallop.   Pulmonary:     Effort: Pulmonary effort is normal. No respiratory distress.     Breath sounds: Normal breath sounds. No wheezing.     Comments: Good air exch Chest:     Chest wall: No tenderness.  Abdominal:     General: Bowel sounds are normal. There is no distension or abdominal bruit.     Palpations: Abdomen is soft. There is no mass.     Tenderness: There is no abdominal tenderness.     Hernia: No hernia is present.  Genitourinary:    Comments: Breast exam: No mass, nodules, thickening, tenderness, bulging, retraction, inflamation, nipple discharge or skin  changes noted.  No axillary or clavicular LA.     Musculoskeletal:        General: No tenderness. Normal range of motion.     Cervical back: Normal range of motion and neck supple. No rigidity. No muscular tenderness.     Right lower leg: No edema.     Left lower leg: No edema.  Lymphadenopathy:     Cervical: No cervical adenopathy.  Skin:    General: Skin is warm and dry.     Coloration: Skin is not pale.     Findings: No erythema or rash.     Comments: Solar lentigines diffusely Wart on L 3rd finger  Neurological:     Mental Status: She is alert. Mental status is at baseline.     Cranial Nerves: No cranial nerve deficit.     Motor: No abnormal muscle tone.     Coordination:  Coordination normal.     Gait: Gait normal.     Deep Tendon Reflexes: Reflexes are normal and symmetric. Reflexes normal.  Psychiatric:        Mood and Affect: Mood normal.        Cognition and Memory: Cognition and memory normal.           Assessment & Plan:   Problem List Items Addressed This Visit      Cardiovascular and Mediastinum   HYPERTENSION, BENIGN ESSENTIAL    bp in fair control at this time  BP Readings from Last 1 Encounters:  11/29/20 132/84   No changes needed Most recent labs reviewed  Disc lifstyle change with low sodium diet and exercise  Plan to continue lisinopril 5 mg daily       Relevant Medications   lisinopril (ZESTRIL) 5 MG tablet     Endocrine   Hypothyroidism    Hypothyroidism  Pt has no clinical changes No change in energy level/ hair or skin/ edema and no tremor Lab Results  Component Value Date   TSH 0.46 11/23/2020    Plan to continue levothyroxine 137 mcg daily      Relevant Medications   levothyroxine (SYNTHROID) 137 MCG tablet     Musculoskeletal and Integument   Osteopenia    dexa ordered  No falls or fractures  Taking ca and D        Other   PURE HYPERCHOLESTEROLEMIA    Disc goals for lipids and reasons to control them Rev last labs with pt Rev low sat fat diet in detail Pt declines statin thus far-discussed pros/cons Proposed crestor 5 mg daily or twice weekly  Handout given for her to review and get back to [place] if she wants to try it      Relevant Medications   lisinopril (ZESTRIL) 5 MG tablet   Routine general medical examination at a health care facility - Primary    Reviewed health habits including diet and exercise and skin cancer prevention Reviewed appropriate screening tests for age  Also reviewed health mt list, fam hx and immunization status , as well as social and family history   See HPI Labs reviewed covid immunized  Interested in shingrix if covered  Recent colonoscopy- 5 y recall        Obesity    Discussed how this problem influences overall health and the risks it imposes  Reviewed plan for weight loss with lower calorie diet (via better food choices and also portion control or program like weight watchers) and exercise building up to or more  than 30 minutes 5 days per week including some aerobic activity         Estrogen deficiency   Relevant Orders   DG Bone Density

## 2020-12-07 ENCOUNTER — Other Ambulatory Visit: Payer: Self-pay | Admitting: Family Medicine

## 2020-12-07 DIAGNOSIS — Z1231 Encounter for screening mammogram for malignant neoplasm of breast: Secondary | ICD-10-CM

## 2021-01-01 ENCOUNTER — Ambulatory Visit
Admission: RE | Admit: 2021-01-01 | Discharge: 2021-01-01 | Disposition: A | Discharge status: Home / Self Care | Payer: Managed Care, Other (non HMO) | Source: Ambulatory Visit | Attending: Family Medicine | Admitting: Family Medicine

## 2021-01-01 ENCOUNTER — Other Ambulatory Visit: Payer: Self-pay

## 2021-01-01 DIAGNOSIS — Z1231 Encounter for screening mammogram for malignant neoplasm of breast: Secondary | ICD-10-CM | POA: Insufficient documentation

## 2021-01-01 DIAGNOSIS — E2839 Other primary ovarian failure: Secondary | ICD-10-CM | POA: Insufficient documentation

## 2021-01-02 ENCOUNTER — Encounter: Payer: Self-pay | Admitting: Family Medicine

## 2021-08-26 ENCOUNTER — Encounter: Payer: Self-pay | Admitting: Family Medicine

## 2021-08-27 MED ORDER — FLUTICASONE-SALMETEROL 100-50 MCG/ACT IN AEPB: 1.0000 | INHALATION_SPRAY | Freq: Two times a day (BID) | RESPIRATORY_TRACT | 3 refills | Status: DC

## 2021-11-24 ENCOUNTER — Telehealth: Payer: Self-pay | Admitting: Family Medicine

## 2021-11-24 DIAGNOSIS — I10 Essential (primary) hypertension: Secondary | ICD-10-CM

## 2021-11-24 DIAGNOSIS — Z0000 Encounter for general adult medical examination without abnormal findings: Secondary | ICD-10-CM

## 2021-11-24 DIAGNOSIS — E7800 Pure hypercholesterolemia, unspecified: Secondary | ICD-10-CM

## 2021-11-24 DIAGNOSIS — E890 Postprocedural hypothyroidism: Secondary | ICD-10-CM

## 2021-11-24 NOTE — Telephone Encounter (Signed)
-----   Message from Ellamae Sia sent at 11/13/2021  3:09 PM EST ----- Regarding: Lab orders for Monday, 2.13.23 Patient is scheduled for CPX labs, please order future labs, Thanks , [name]

## 2021-11-25 ENCOUNTER — Other Ambulatory Visit: Payer: Self-pay

## 2021-11-25 ENCOUNTER — Other Ambulatory Visit (INDEPENDENT_AMBULATORY_CARE_PROVIDER_SITE_OTHER): Payer: Managed Care, Other (non HMO)

## 2021-11-25 DIAGNOSIS — I10 Essential (primary) hypertension: Secondary | ICD-10-CM | POA: Diagnosis not present

## 2021-11-25 DIAGNOSIS — E890 Postprocedural hypothyroidism: Secondary | ICD-10-CM

## 2021-11-25 DIAGNOSIS — E7800 Pure hypercholesterolemia, unspecified: Secondary | ICD-10-CM

## 2021-11-25 LAB — CBC WITH DIFFERENTIAL/PLATELET
Basophils Absolute: 0 10*3/uL (ref 0.0–0.1)
Basophils Relative: 0.5 % (ref 0.0–3.0)
Eosinophils Absolute: 0.2 10*3/uL (ref 0.0–0.7)
Eosinophils Relative: 3.6 % (ref 0.0–5.0)
HCT: 40.4 % (ref 36.0–46.0)
Hemoglobin: 13.3 g/dL (ref 12.0–15.0)
Lymphocytes Relative: 32.7 % (ref 12.0–46.0)
Lymphs Abs: 2.2 10*3/uL (ref 0.7–4.0)
MCHC: 32.8 g/dL (ref 30.0–36.0)
MCV: 87 fl (ref 78.0–100.0)
Monocytes Absolute: 0.5 10*3/uL (ref 0.1–1.0)
Monocytes Relative: 7.3 % (ref 3.0–12.0)
Neutro Abs: 3.8 10*3/uL (ref 1.4–7.7)
Neutrophils Relative %: 55.9 % (ref 43.0–77.0)
Platelets: 235 10*3/uL (ref 150.0–400.0)
RBC: 4.64 Mil/uL (ref 3.87–5.11)
RDW: 13.5 % (ref 11.5–15.5)
WBC: 6.7 10*3/uL (ref 4.0–10.5)

## 2021-11-25 LAB — TSH: TSH: 0.53 u[IU]/mL (ref 0.35–5.50)

## 2021-11-26 LAB — COMPREHENSIVE METABOLIC PANEL
ALT: 12 U/L (ref 0–35)
AST: 16 U/L (ref 0–37)
Albumin: 4.5 g/dL (ref 3.5–5.2)
Alkaline Phosphatase: 87 U/L (ref 39–117)
BUN: 15 mg/dL (ref 6–23)
CO2: 33 mEq/L — ABNORMAL HIGH (ref 19–32)
Calcium: 9.8 mg/dL (ref 8.4–10.5)
Chloride: 102 mEq/L (ref 96–112)
Creatinine, Ser: 0.8 mg/dL (ref 0.40–1.20)
GFR: 74.93 mL/min (ref >60.00)
Glucose, Bld: 100 mg/dL — ABNORMAL HIGH (ref 70–99)
Potassium: 4.4 mEq/L (ref 3.5–5.1)
Sodium: 140 mEq/L (ref 135–145)
Total Bilirubin: 0.5 mg/dL (ref 0.2–1.2)
Total Protein: 6.9 g/dL (ref 6.0–8.3)

## 2021-11-26 LAB — LIPID PANEL
Cholesterol: 238 mg/dL — ABNORMAL HIGH (ref 0–200)
HDL: 57.4 mg/dL (ref >39.00)
LDL Cholesterol: 145 mg/dL — ABNORMAL HIGH (ref 0–99)
NonHDL: 180.55
Total CHOL/HDL Ratio: 4
Triglycerides: 179 mg/dL — ABNORMAL HIGH (ref 0.0–149.0)
VLDL: 35.8 mg/dL (ref 0.0–40.0)

## 2021-12-01 ENCOUNTER — Other Ambulatory Visit: Payer: Self-pay | Admitting: Family Medicine

## 2021-12-02 ENCOUNTER — Other Ambulatory Visit: Payer: Self-pay

## 2021-12-02 ENCOUNTER — Ambulatory Visit (INDEPENDENT_AMBULATORY_CARE_PROVIDER_SITE_OTHER): Payer: Managed Care, Other (non HMO) | Admitting: Family Medicine

## 2021-12-02 ENCOUNTER — Encounter: Payer: Self-pay | Admitting: Family Medicine

## 2021-12-02 VITALS — BP 130/82 | HR 55 | Temp 97.8°F | Ht 63.0 in | Wt 176.0 lb

## 2021-12-02 DIAGNOSIS — E890 Postprocedural hypothyroidism: Secondary | ICD-10-CM | POA: Diagnosis not present

## 2021-12-02 DIAGNOSIS — Z23 Encounter for immunization: Secondary | ICD-10-CM

## 2021-12-02 DIAGNOSIS — I10 Essential (primary) hypertension: Secondary | ICD-10-CM

## 2021-12-02 DIAGNOSIS — M85859 Other specified disorders of bone density and structure, unspecified thigh: Secondary | ICD-10-CM

## 2021-12-02 DIAGNOSIS — Z0000 Encounter for general adult medical examination without abnormal findings: Secondary | ICD-10-CM | POA: Diagnosis not present

## 2021-12-02 DIAGNOSIS — E7800 Pure hypercholesterolemia, unspecified: Secondary | ICD-10-CM

## 2021-12-02 DIAGNOSIS — Z1211 Encounter for screening for malignant neoplasm of colon: Secondary | ICD-10-CM

## 2021-12-02 MED ORDER — LISINOPRIL 5 MG PO TABS: ORAL_TABLET | ORAL | 3 refills | Status: DC

## 2021-12-02 MED ORDER — LEVOTHYROXINE SODIUM 137 MCG PO TABS
ORAL_TABLET | ORAL | 3 refills | Status: DC
Start: 2021-12-02 — End: 2022-11-28

## 2021-12-02 MED ORDER — FLUTICASONE-SALMETEROL 100-50 MCG/ACT IN AEPB: 1.0000 | INHALATION_SPRAY | Freq: Two times a day (BID) | RESPIRATORY_TRACT | 3 refills | Status: DC

## 2021-12-02 NOTE — Assessment & Plan Note (Signed)
Colonoscopy utd from 11/2020

## 2021-12-02 NOTE — Assessment & Plan Note (Signed)
bp in fair control at this time  BP Readings from Last 1 Encounters:  12/02/21 130/82   No changes needed Most recent labs reviewed  Disc lifstyle change with low sodium diet and exercise  Plan to continue levothyroxine 137 mcg daily

## 2021-12-02 NOTE — Assessment & Plan Note (Signed)
dexa 2022 No falls or fx Taking ca and D Walking for exercise, plans to add yoga   Re check 1 y

## 2021-12-02 NOTE — Assessment & Plan Note (Signed)
Hypothyroidism  Pt has no clinical changes No change in energy level/ hair or skin/ edema and no tremor Lab Results  Component Value Date   TSH 0.53 11/25/2021     Plan to continue levothyroxine 137 mcg daily

## 2021-12-02 NOTE — Progress Notes (Signed)
Subjective:    Patient ID: Jodi Steele, female    DOB: Jan 30, 1951, 71 y.o.   MRN: [id number]  This visit occurred during the SARS-CoV-2 public health emergency.  Safety protocols were in place, including screening questions prior to the visit, additional usage of staff PPE, and extensive cleaning of exam room while observing appropriate contact time as indicated for disinfecting solutions.   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   Zoster status  : may be interested in shingrix  Had zostavax in 2015   Mammogram 12/2020  Self breast exam-normal   Covid immunized  Flu shot : did not get one this season, will skip it this time  Tdap 07/2012    Colonoscopy 11/2020  Pna vaccine utd  Dexa 12/2020 osteopenia  No falls  No fractures  Has become shorter Taking ca and D Walking    HTN bp is stable today  No cp or palpitations or headaches or edema  No side effects to medicines  BP Readings from Last 3 Encounters:  12/02/21 130/82  11/29/20 132/84  04/30/20 130/90       Lisinopril 5mg  daily   Hypothyroidism  Pt has no clinical changes No change in energy level/ hair or skin/ edema and no tremor Lab Results  Component Value Date   TSH 0.53 11/25/2021     Levothyroxine 137 mcg daily   Hyperlipidemia Lab Results  Component Value Date   CHOL 238 (H) 11/25/2021   CHOL 240 (H) 11/23/2020   CHOL 227 (H) 11/07/2019   Lab Results  Component Value Date   HDL 57.40 11/25/2021   HDL 58.70 11/23/2020   HDL 58.80 11/07/2019   Lab Results  Component  Value Date   LDLCALC 145 (H) 11/25/2021   LDLCALC 159 (H) 11/23/2020   LDLCALC 150 (H) 11/07/2019   Lab Results  Component Value Date   TRIG 179.0 (H) 11/25/2021   TRIG 114.0 11/23/2020   TRIG 91.0 11/07/2019   Lab Results  Component Value Date   CHOLHDL 4 11/25/2021   CHOLHDL 4 11/23/2020   CHOLHDL 4 11/07/2019   Lab Results  Component Value Date   LDLDIRECT 144.4 12/06/2013   LDLDIRECT 128.3 11/17/2011   LDLDIRECT 151.7 08/15/2011   Pt has declined a statin so far Too much sugar/sweets-trig is up   Much less fatty meat Avoids fried foods     The 10-year ASCVD risk score (Arnett DK, et al., 2019) is: 12.3%   Values used to calculate the score:     Age: 41 years     Sex: Female     Is Non-Hispanic African American: No     Diabetic: No     Tobacco smoker: No     Systolic Blood Pressure: [id number] mmHg     Is BP treated: Yes     HDL Cholesterol: 57.4 mg/dL     Total Cholesterol: 238 mg/dL  Lab Results  Component Value Date   CREATININE 0.80 11/25/2021   BUN  15 11/25/2021   NA 140 11/25/2021   K 4.4 11/25/2021   CL 102 11/25/2021   CO2 33 (H) 11/25/2021   Lab Results  Component Value Date   ALT 12 11/25/2021   AST 16 11/25/2021   ALKPHOS 87 11/25/2021   BILITOT 0.5 11/25/2021   Lab Results  Component Value Date   WBC 6.7 11/25/2021   HGB 13.3 11/25/2021   HCT 40.4 11/25/2021   MCV 87.0 11/25/2021   PLT 235.0 11/25/2021   Glucose was 100   Uses advair once daily - has done well with it   Patient Active Problem List   Diagnosis Date Noted   Lump on finger, left 11/10/2019   Osteopenia 01/18/2018   Estrogen deficiency 09/14/2017   Colon cancer screening 06/27/2015   Encounter for routine gynecological examination 12/10/2012   Obesity 11/28/2011   Other screening mammogram 08/22/2011   Routine general medical examination at a health care facility 08/14/2011   Hypothyroidism 09/04/2008   PURE HYPERCHOLESTEROLEMIA 09/04/2008   HYPERTENSION, BENIGN  ESSENTIAL 04/20/2007   DEPRESSION 04/07/2007   ASTHMA 04/07/2007   FIBROCYSTIC BREAST DISEASE 04/07/2007   BUNION 04/07/2007   Past Medical History:  Diagnosis Date   Asthma    related to pet/dog exposure   Bunion    Colon polyp    Depression    Diffuse cystic mastopathy    Goiter, unspecified    surgery   HTN (hypertension)    Hypothyroidism    Pure hypercholesterolemia    Past Surgical History:  Procedure Laterality Date   BREAST BIOPSY Right 1985   abcsess   BREAST CYST ASPIRATION Left    negative   COLONOSCOPY  11/10   polyp   INCISION AND DRAINAGE BREAST ABSCESS     right   THYROIDECTOMY     TUBAL LIGATION     Social History   Tobacco Use   Smoking status: Former    Types: Cigarettes    Quit date: 10/13/1988    Years since quitting: 33.1    Passive exposure: Past   Smokeless tobacco: Never  Substance Use Topics   Alcohol use: Yes    Alcohol/week: 0.0 standard drinks    Comment: daily usually wine   Drug use: No   Family History  Problem Relation Age of Onset   Other Father        Mesothelioma   Thyroid cancer Mother        Anaplastic thyroid cancer   Hypertension Mother    Diabetes Mother    Thyroid cancer Sister    Diabetes Other        GF   Breast cancer Neg Hx    No Known Allergies Current Outpatient Medications on File Prior to Visit  Medication Sig Dispense Refill   albuterol (VENTOLIN HFA) 108 (90 Base) MCG/ACT inhaler INHALE 2 PUFFS BY MOUTH EVERY 4 HOURS AS NEEDED 18 g 5   Ascorbic Acid (VITAMIN C) 1000 MG tablet Take 1,000 mg by mouth daily.     Calcium Carbonate (CALCIUM 600 PO) Take 1,200 mg by mouth daily.     No current facility-administered medications on file prior to visit.    Review of Systems  Constitutional:  Negative for activity change, appetite change, fatigue, fever and unexpected weight change.  HENT:  Negative for congestion, ear pain, rhinorrhea, sinus pressure and sore throat.   Eyes:  Negative for pain, redness  and visual disturbance.  Respiratory:  Negative for cough, shortness of breath and wheezing.  Cardiovascular:  Negative for chest pain and palpitations.  Gastrointestinal:  Negative for abdominal pain, blood in stool, constipation and diarrhea.  Endocrine: Negative for polydipsia and polyuria.  Genitourinary:  Negative for dysuria, frequency and urgency.  Musculoskeletal:  Negative for arthralgias, back pain and myalgias.  Skin:  Negative for pallor and rash.  Allergic/Immunologic: Negative for environmental allergies.  Neurological:  Negative for dizziness, syncope and headaches.  Hematological:  Negative for adenopathy. Does not bruise/bleed easily.  Psychiatric/Behavioral:  Negative for decreased concentration and dysphoric mood. The patient is not nervous/anxious.        Some stressors       Objective:   Physical Exam Constitutional:      General: She is not in acute distress.    Appearance: Normal appearance. She is well-developed. She is obese. She is not ill-appearing or diaphoretic.  HENT:     Head: Normocephalic and atraumatic.     Right Ear: Tympanic membrane, ear canal and external ear normal.     Left Ear: Tympanic membrane, ear canal and external ear normal.     Nose: Nose normal. No congestion.     Mouth/Throat:     Mouth: Mucous membranes are moist.     Pharynx: Oropharynx is clear. No posterior oropharyngeal erythema.  Eyes:     General: No scleral icterus.    Extraocular Movements: Extraocular movements intact.     Conjunctiva/sclera: Conjunctivae normal.     Pupils: Pupils are equal, round, and reactive to light.  Neck:     Thyroid: No thyromegaly.     Vascular: No carotid bruit or JVD.  Cardiovascular:     Rate and Rhythm: Normal rate and regular rhythm.     Pulses: Normal pulses.     Heart sounds: Normal heart sounds.    No gallop.  Pulmonary:     Effort: Pulmonary effort is normal. No respiratory distress.     Breath sounds: Normal breath sounds. No  wheezing.     Comments: Good air exch Chest:     Chest wall: No tenderness.  Abdominal:     General: Bowel sounds are normal. There is no distension or abdominal bruit.     Palpations: Abdomen is soft. There is no mass.     Tenderness: There is no abdominal tenderness.     Hernia: No hernia is present.  Genitourinary:    Comments: Breast exam: No mass, nodules, thickening, tenderness, bulging, retraction, inflamation, nipple discharge or skin changes noted.  No axillary or clavicular LA.     Musculoskeletal:        General: No tenderness. Normal range of motion.     Cervical back: Normal range of motion and neck supple. No rigidity. No muscular tenderness.     Right lower leg: No edema.     Left lower leg: No edema.     Comments: No acute joint changes   Lymphadenopathy:     Cervical: No cervical adenopathy.  Skin:    General: Skin is warm and dry.     Coloration: Skin is not pale.     Findings: No erythema or rash.     Comments: Solar lentigines diffusely   Neurological:     Mental Status: She is alert. Mental status is at baseline.     Cranial Nerves: No cranial nerve deficit.     Motor: No abnormal muscle tone.     Coordination: Coordination normal.     Gait: Gait normal.     Deep Tendon Reflexes:  Reflexes are normal and symmetric. Reflexes normal.  Psychiatric:        Mood and Affect: Mood normal.        Cognition and Memory: Cognition and memory normal.          Assessment & Plan:   Problem List Items Addressed This Visit       Cardiovascular and Mediastinum   HYPERTENSION, BENIGN ESSENTIAL    bp in fair control at this time  BP Readings from Last 1 Encounters:  12/02/21 130/82  No changes needed Most recent labs reviewed  Disc lifstyle change with low sodium diet and exercise  Plan to continue levothyroxine 137 mcg daily      Relevant Medications   lisinopril (ZESTRIL) 5 MG tablet     Endocrine   Hypothyroidism    Hypothyroidism  Pt has no clinical  changes No change in energy level/ hair or skin/ edema and no tremor Lab Results  Component Value Date   TSH 0.53 11/25/2021     Plan to continue levothyroxine 137 mcg daily      Relevant Medications   levothyroxine (SYNTHROID) 137 MCG tablet     Musculoskeletal and Integument   Osteopenia    dexa 2022 No falls or fx Taking ca and D Walking for exercise, plans to add yoga   Re check 1 y        Other   Colon cancer screening    Colonoscopy utd from 11/2020      PURE HYPERCHOLESTEROLEMIA    Disc goals for lipids and reasons to control them Rev last labs with pt Rev low sat fat diet in detail LDL is down to 145 Good HDL Pt declines statin      Relevant Medications   lisinopril (ZESTRIL) 5 MG tablet   Routine general medical examination at a health care facility - Primary    Reviewed health habits including diet and exercise and skin cancer prevention Reviewed appropriate screening tests for age  Also reviewed health mt list, fam hx and immunization status , as well as social and family history   See HPI Labs reviewed  Interested in shingrix if covered  Mammogram utd Colonoscopy utd dexa utd, no falls or fractures        Other Visit Diagnoses     Need for Tdap vaccination       Relevant Orders   Tdap vaccine greater than or equal to 7yo IM (Completed)

## 2021-12-02 NOTE — Assessment & Plan Note (Signed)
Reviewed health habits including diet and exercise and skin cancer prevention Reviewed appropriate screening tests for age  Also reviewed health mt list, fam hx and immunization status , as well as social and family history   See HPI Labs reviewed  Interested in shingrix if covered  Mammogram utd Colonoscopy utd dexa utd, no falls or fractures

## 2021-12-02 NOTE — Patient Instructions (Addendum)
If you are interested in the new shingles vaccine (Shingrix) - call your local pharmacy to check on coverage and availability  If affordable, get on a wait list at your pharmacy to get the vaccine.  Tetanus shot today  Keep walking  Get started with yoga when you can   For cholesterol Avoid red meat/ fried foods/ egg yolks/ fatty breakfast meats/ butter, cheese and high fat dairy/ and shellfish   Get back on track with healthy diet with less sweets/sugar

## 2021-12-02 NOTE — Assessment & Plan Note (Signed)
Disc goals for lipids and reasons to control them Rev last labs with pt Rev low sat fat diet in detail LDL is down to 145 Good HDL Pt declines statin

## 2021-12-10 ENCOUNTER — Other Ambulatory Visit: Payer: Self-pay | Admitting: Family Medicine

## 2021-12-10 DIAGNOSIS — Z1231 Encounter for screening mammogram for malignant neoplasm of breast: Secondary | ICD-10-CM

## 2021-12-31 ENCOUNTER — Telehealth: Payer: Self-pay | Admitting: *Deleted

## 2021-12-31 MED ORDER — BUDESONIDE-FORMOTEROL FUMARATE 80-4.5 MCG/ACT IN AERO: 2.0000 | INHALATION_SPRAY | Freq: Two times a day (BID) | RESPIRATORY_TRACT | 3 refills | Status: DC

## 2021-12-31 NOTE — Telephone Encounter (Signed)
Called spoke with patient to informed her that insurance would not long cover the the advair, that new was sent to her pharmacy , please let us know if she has any concerns or any issues. Pt said that she will follow up with [place] if she does.  ?

## 2021-12-31 NOTE — Telephone Encounter (Signed)
Received fax saying pt's Advair isn't covered and requesting Rx be sent for alt covered med. The meds that are covered they list are: ? ?Wixela inhub,advair HFA, Adair Patter, Dulera, Symbicort ? ?[facility] [address] ?

## 2021-12-31 NOTE — Telephone Encounter (Signed)
I sent in symbicort ?Please let pt know  ?Should be similar  ?If any problems alert me  ?

## 2022-01-08 ENCOUNTER — Ambulatory Visit
Admission: RE | Admit: 2022-01-08 | Discharge: 2022-01-08 | Disposition: A | Discharge status: Home / Self Care | Payer: Managed Care, Other (non HMO) | Source: Ambulatory Visit | Attending: Family Medicine | Admitting: Family Medicine

## 2022-01-08 DIAGNOSIS — Z1231 Encounter for screening mammogram for malignant neoplasm of breast: Secondary | ICD-10-CM | POA: Diagnosis present

## 2022-05-14 ENCOUNTER — Encounter: Payer: Self-pay | Admitting: Family Medicine

## 2022-05-18 ENCOUNTER — Other Ambulatory Visit: Payer: Self-pay | Admitting: Family Medicine

## 2022-11-26 ENCOUNTER — Telehealth: Payer: Self-pay | Admitting: Family Medicine

## 2022-11-26 DIAGNOSIS — I10 Essential (primary) hypertension: Secondary | ICD-10-CM

## 2022-11-26 DIAGNOSIS — E7800 Pure hypercholesterolemia, unspecified: Secondary | ICD-10-CM

## 2022-11-26 DIAGNOSIS — E890 Postprocedural hypothyroidism: Secondary | ICD-10-CM

## 2022-11-26 NOTE — Telephone Encounter (Signed)
-----   Message from Velna Hatchet, RT sent at 11/10/2022 10:34 AM EST ----- Regarding: Fri 2/15 lab Patient is scheduled for cpx, please order future labs.  Thanks, Anda Kraft

## 2022-11-27 ENCOUNTER — Other Ambulatory Visit: Payer: Self-pay | Admitting: Family Medicine

## 2022-11-28 ENCOUNTER — Other Ambulatory Visit (INDEPENDENT_AMBULATORY_CARE_PROVIDER_SITE_OTHER): Payer: Medicare HMO

## 2022-11-28 DIAGNOSIS — E890 Postprocedural hypothyroidism: Secondary | ICD-10-CM

## 2022-11-28 DIAGNOSIS — E7800 Pure hypercholesterolemia, unspecified: Secondary | ICD-10-CM | POA: Diagnosis not present

## 2022-11-28 DIAGNOSIS — I10 Essential (primary) hypertension: Secondary | ICD-10-CM

## 2022-11-28 LAB — CBC WITH DIFFERENTIAL/PLATELET
Basophils Absolute: 0 10*3/uL (ref 0.0–0.1)
Basophils Relative: 0.3 % (ref 0.0–3.0)
Eosinophils Absolute: 0.3 10*3/uL (ref 0.0–0.7)
Eosinophils Relative: 3.7 % (ref 0.0–5.0)
HCT: 41.1 % (ref 36.0–46.0)
Hemoglobin: 13.6 g/dL (ref 12.0–15.0)
Lymphocytes Relative: 22.5 % (ref 12.0–46.0)
Lymphs Abs: 2 10*3/uL (ref 0.7–4.0)
MCHC: 33 g/dL (ref 30.0–36.0)
MCV: 87.9 fl (ref 78.0–100.0)
Monocytes Absolute: 0.6 10*3/uL (ref 0.1–1.0)
Monocytes Relative: 6.4 % (ref 3.0–12.0)
Neutro Abs: 6.1 10*3/uL (ref 1.4–7.7)
Neutrophils Relative %: 67.1 % (ref 43.0–77.0)
Platelets: 243 10*3/uL (ref 150.0–400.0)
RBC: 4.68 Mil/uL (ref 3.87–5.11)
RDW: 13.5 % (ref 11.5–15.5)
WBC: 9.1 10*3/uL (ref 4.0–10.5)

## 2022-11-28 LAB — COMPREHENSIVE METABOLIC PANEL
ALT: 16 U/L (ref 0–35)
AST: 15 U/L (ref 0–37)
Albumin: 4.2 g/dL (ref 3.5–5.2)
Alkaline Phosphatase: 85 U/L (ref 39–117)
BUN: 13 mg/dL (ref 6–23)
CO2: 30 mEq/L (ref 19–32)
Calcium: 9.7 mg/dL (ref 8.4–10.5)
Chloride: 100 mEq/L (ref 96–112)
Creatinine, Ser: 0.81 mg/dL (ref 0.40–1.20)
GFR: 73.3 mL/min (ref >60.00)
Glucose, Bld: 99 mg/dL (ref 70–99)
Potassium: 4.1 mEq/L (ref 3.5–5.1)
Sodium: 138 mEq/L (ref 135–145)
Total Bilirubin: 0.5 mg/dL (ref 0.2–1.2)
Total Protein: 6.8 g/dL (ref 6.0–8.3)

## 2022-11-28 LAB — TSH: TSH: 0.28 u[IU]/mL — ABNORMAL LOW (ref 0.35–5.50)

## 2022-11-28 LAB — LIPID PANEL
Cholesterol: 221 mg/dL — ABNORMAL HIGH (ref 0–200)
HDL: 52.4 mg/dL (ref >39.00)
LDL Cholesterol: 147 mg/dL — ABNORMAL HIGH (ref 0–99)
NonHDL: 168.17
Total CHOL/HDL Ratio: 4
Triglycerides: 106 mg/dL (ref 0.0–149.0)
VLDL: 21.2 mg/dL (ref 0.0–40.0)

## 2022-11-28 NOTE — Telephone Encounter (Signed)
Pt notified of lab results and Dr. Marliss Coots comments pt will get new dose of med

## 2022-11-28 NOTE — Telephone Encounter (Signed)
Pt had CPE labs today and CPE scheduled 12/09/22, will route to PCP for review to see if she wanted to change any meds based off of her lab results

## 2022-11-28 NOTE — Telephone Encounter (Signed)
Her TSH was low so I took dose for levothy down to 125 mcg Please let her know  We will discuss more at her appt  Thanks

## 2022-12-05 ENCOUNTER — Encounter: Payer: Managed Care, Other (non HMO) | Admitting: Family Medicine

## 2022-12-09 ENCOUNTER — Encounter: Payer: Self-pay | Admitting: Family Medicine

## 2022-12-09 ENCOUNTER — Other Ambulatory Visit: Payer: Self-pay | Admitting: Family Medicine

## 2022-12-09 ENCOUNTER — Ambulatory Visit (INDEPENDENT_AMBULATORY_CARE_PROVIDER_SITE_OTHER): Payer: Medicare HMO | Admitting: Family Medicine

## 2022-12-09 VITALS — BP 131/80 | HR 77 | Temp 97.7°F | Ht 63.0 in | Wt 173.1 lb

## 2022-12-09 DIAGNOSIS — M25511 Pain in right shoulder: Secondary | ICD-10-CM | POA: Insufficient documentation

## 2022-12-09 DIAGNOSIS — G8929 Other chronic pain: Secondary | ICD-10-CM

## 2022-12-09 DIAGNOSIS — E2839 Other primary ovarian failure: Secondary | ICD-10-CM

## 2022-12-09 DIAGNOSIS — M85859 Other specified disorders of bone density and structure, unspecified thigh: Secondary | ICD-10-CM

## 2022-12-09 DIAGNOSIS — E890 Postprocedural hypothyroidism: Secondary | ICD-10-CM

## 2022-12-09 DIAGNOSIS — E6609 Other obesity due to excess calories: Secondary | ICD-10-CM

## 2022-12-09 DIAGNOSIS — M25512 Pain in left shoulder: Secondary | ICD-10-CM

## 2022-12-09 DIAGNOSIS — I10 Essential (primary) hypertension: Secondary | ICD-10-CM

## 2022-12-09 DIAGNOSIS — Z6830 Body mass index (BMI) 30.0-30.9, adult: Secondary | ICD-10-CM

## 2022-12-09 DIAGNOSIS — Z0000 Encounter for general adult medical examination without abnormal findings: Secondary | ICD-10-CM

## 2022-12-09 DIAGNOSIS — E66811 Obesity, class 1: Secondary | ICD-10-CM

## 2022-12-09 DIAGNOSIS — J4520 Mild intermittent asthma, uncomplicated: Secondary | ICD-10-CM

## 2022-12-09 DIAGNOSIS — Z1231 Encounter for screening mammogram for malignant neoplasm of breast: Secondary | ICD-10-CM

## 2022-12-09 DIAGNOSIS — E7800 Pure hypercholesterolemia, unspecified: Secondary | ICD-10-CM

## 2022-12-09 MED ORDER — LEVOTHYROXINE SODIUM 112 MCG PO TABS: 112.0000 ug | ORAL_TABLET | Freq: Every day | ORAL | 1 refills | Status: DC

## 2022-12-09 MED ORDER — BUDESONIDE-FORMOTEROL FUMARATE 80-4.5 MCG/ACT IN AERO: 2.0000 | INHALATION_SPRAY | Freq: Two times a day (BID) | RESPIRATORY_TRACT | 3 refills | Status: DC

## 2022-12-09 MED ORDER — LISINOPRIL 5 MG PO TABS: ORAL_TABLET | ORAL | 3 refills | Status: DC

## 2022-12-09 NOTE — Assessment & Plan Note (Signed)
Mammogram ordered °Pt will call to schedule  °

## 2022-12-09 NOTE — Patient Instructions (Addendum)
Let me work on the PT situation for your shoulders I put the referral in  Please let us know if you don't hear in 1-2 weeks   If you are interested in the new shingles vaccine (Shingrix) - call your local pharmacy to check on coverage and availability  If affordable, get on a wait list at your pharmacy to get the vaccine.  Call the [facility] breast center to schedule your mammogram and bone density test    Take care of yourself   Go down to 112 mcg daily of levothyroxine   Schedule labs in 6 weeks to re check thyroid

## 2022-12-09 NOTE — Assessment & Plan Note (Signed)
Disc goals for lipids and reasons to control them Rev last labs with pt Rev low sat fat diet in detail  Stable Trig are down LDL Of 147 Declines statin treatment Diet is improved

## 2022-12-09 NOTE — Assessment & Plan Note (Signed)
Discussed how this problem influences overall health and the risks it imposes  Reviewed plan for weight loss with lower calorie diet (via better food choices and also portion control or program like weight watchers) and exercise building up to or more than 30 minutes 5 days per week including some aerobic activity    

## 2022-12-09 NOTE — Assessment & Plan Note (Signed)
Dexa ordered Pt will call to schedule  No falls or fx  Taking ca and D  Enc her to add more strength training for exercise

## 2022-12-09 NOTE — Assessment & Plan Note (Signed)
Ongoing Old injuries Interested in PT

## 2022-12-09 NOTE — Assessment & Plan Note (Signed)
Hypothyroidism  Pt has no clinical changes No change in energy level/ hair or skin/ edema and no tremor Lab Results  Component Value Date   TSH 0.28 (L) 11/28/2022    Will go down on levothyroxine from 125 to 112 mcg and re check in 6 wk

## 2022-12-09 NOTE — Assessment & Plan Note (Signed)
bp in fair control at this time  BP Readings from Last 1 Encounters:  12/09/22 131/80   No changes needed Most recent labs reviewed  Disc lifstyle change with low sodium diet and exercise  Plan to continue lisinopril 5 mg daily

## 2022-12-09 NOTE — Progress Notes (Signed)
Subjective:    Patient ID: Jodi Steele, female    DOB: [birth year], 72 y.o.   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   Exercise- has used a Geneticist, molecular work out program  Is wrestling with shoulder problems  Wonders if she should have some PT for that  Saw Dr Lorelei Pont in the past for this- noted arthritis (also had old shoulder injury/fx in [place] in the past)    Immunization History  Administered Date(s) Administered   Influenza Split 07/31/2011   Influenza Whole 07/14/2007   Influenza, High Dose Seasonal PF 07/19/2019   Influenza,inj,Quad PF,6+ Mos 09/14/2017   Influenza-Unspecified 07/26/2015, 07/29/2018, 07/12/2020   Moderna Sars-Covid-2 Vaccination 12/05/2019, 01/03/2020, 10/18/2020   Pneumococcal Conjugate-13 09/14/2017   Pneumococcal Polysaccharide-23 09/15/2018   Td 02/17/2005   Tdap 07/28/2012, 12/02/2021   Zoster, Live 12/25/2013   Health Maintenance Due  Topic Date Due   Hepatitis C Screening  Never done   Zoster Vaccines- Shingrix (1 of 2) Never done   INFLUENZA VACCINE  05/13/2022   COVID-19 Vaccine (4 - 2023-24 season) 06/13/2022    Shingrix- had zostavax / may be interested in shingrix  Flu shot - did not get this fall    Mammogram 12/2021-[place]  Self breast exam no lumps  Colonoscopy 11/2020   Dexa  12/2020 osteopenia  Falls: none Fractures:none  Supplements  ca plus D  Exercise - virtual reality program   Taking a tart  cherry extract   Eating a balanced diet High protein smoothie with vegetables    HTN bp is stable today  No cp or palpitations or headaches or edema  No side effects to medicines  BP Readings from Last 3 Encounters:  12/09/22 (!) 138/92  12/02/21 130/82  11/29/20 132/84    Pulse Readings from Last 3 Encounters:  12/09/22 77  12/02/21 (!) 55  11/29/20 63   Lisinopril 5 mg daily  Lab Results  Component Value Date   CREATININE 0.81 11/28/2022   BUN 13 11/28/2022   NA 138 11/28/2022   K 4.1 11/28/2022   CL 100 11/28/2022   CO2 30 11/28/2022  GFR of 73.3   Hypothyroidism  Pt has no clinical changes No change in energy level/ hair or skin/ edema and no tremor Lab Results  Component Value Date   TSH 0.28 (L) 11/28/2022    Levothyroxine 125 mcg daily    Hyperlipidemia Lab Results  Component Value Date   CHOL 221 (H) 11/28/2022   CHOL 238 (H) 11/25/2021   CHOL 240 (H) 11/23/2020   Lab Results  Component Value Date   HDL 52.40 11/28/2022   HDL 57.40 11/25/2021   HDL 58.70 11/23/2020   Lab Results  Component Value Date   LDLCALC 147 (H) 11/28/2022   LDLCALC 145 (H) 11/25/2021   LDLCALC 159 (H) 11/23/2020  Lab Results  Component Value Date   TRIG 106.0 11/28/2022   TRIG 179.0 (H) 11/25/2021   TRIG 114.0 11/23/2020   Lab Results  Component Value Date   CHOLHDL 4 11/28/2022   CHOLHDL 4 11/25/2021   CHOLHDL 4 11/23/2020   Lab Results  Component Value Date   LDLDIRECT 144.4 12/06/2013   LDLDIRECT 128.3 11/17/2011   LDLDIRECT 151.7 08/15/2011   declines statin in past  Eats eggs on weekends  Rarely eats shrimp  Diet is good   The 10-year ASCVD risk score (Arnett DK, et al., 2019) is: 13.7%   Values used to calculate the score:     Age: 73 years     Sex: Female     Is Non-Hispanic African American: No     Diabetic: No     Tobacco smoker: No     Systolic Blood Pressure: [id number] mmHg     Is BP treated: Yes     HDL Cholesterol: 52.4 mg/dL     Total  Cholesterol: 221 mg/dL   Lab Results  Component Value Date   WBC 9.1 11/28/2022   HGB 13.6 11/28/2022   HCT 41.1 11/28/2022   MCV 87.9 11/28/2022   PLT 243.0 11/28/2022     Patient Active Problem List   Diagnosis Date Noted   Encounter for screening mammogram for breast cancer 12/09/2022   Osteopenia 01/18/2018   Estrogen deficiency 09/14/2017   Colon cancer screening 06/27/2015   Encounter for routine gynecological examination 12/10/2012   Obesity 11/28/2011   Routine general medical examination at a health care facility 08/14/2011   Hypothyroidism 09/04/2008   Pure hypercholesterolemia 09/04/2008   HYPERTENSION, BENIGN ESSENTIAL 04/20/2007   DEPRESSION 04/07/2007   Asthma 04/07/2007   FIBROCYSTIC BREAST DISEASE 04/07/2007   BUNION 04/07/2007   Past Medical History:  Diagnosis Date   Asthma    related to pet/dog exposure   Bunion    Colon polyp    Depression    Diffuse cystic mastopathy    Goiter, unspecified    surgery   HTN (hypertension)    Hypothyroidism    Pure hypercholesterolemia    Past Surgical History:  Procedure Laterality Date   BREAST BIOPSY Right 1985   abcsess   BREAST CYST ASPIRATION Left    negative   COLONOSCOPY  11/10   polyp   INCISION AND DRAINAGE BREAST ABSCESS     right   THYROIDECTOMY     TUBAL LIGATION     Social History   Tobacco Use   Smoking status: Former    Types: Cigarettes    Quit date: 10/13/1988    Years since quitting: 34.1    Passive exposure: Past   Smokeless tobacco: Never  Substance Use Topics   Alcohol use: Yes    Alcohol/week: 0.0 standard drinks of alcohol    Comment: daily usually wine   Drug use: No   Family History  Problem Relation Age of Onset   Other Father        Mesothelioma   Thyroid cancer Mother        Anaplastic thyroid cancer   Hypertension Mother    Diabetes Mother    Thyroid cancer Sister    Diabetes Other        GF   Breast cancer Neg Hx    No Known Allergies Current  Outpatient Medications on File Prior to Visit  Medication Sig Dispense Refill   albuterol (VENTOLIN HFA) 108 (90 Base) MCG/ACT inhaler INHALE 2 PUFFS  BY MOUTH EVERY 4 HOURS AS NEEDED 18 g 5   Ascorbic Acid (VITAMIN C) 1000 MG tablet Take 1,000 mg by mouth daily.     Calcium Carbonate (CALCIUM 600 PO) Take 1,200 mg by mouth daily.     No current facility-administered medications on file prior to visit.     Review of Systems  Constitutional:  Negative for activity change, appetite change, fatigue, fever and unexpected weight change.  HENT:  Negative for congestion, ear pain, rhinorrhea, sinus pressure and sore throat.   Eyes:  Negative for pain, redness and visual disturbance.  Respiratory:  Negative for cough, shortness of breath and wheezing.   Cardiovascular:  Negative for chest pain and palpitations.  Gastrointestinal:  Negative for abdominal pain, blood in stool, constipation and diarrhea.  Endocrine: Negative for polydipsia and polyuria.  Genitourinary:  Negative for dysuria, frequency and urgency.  Musculoskeletal:  Positive for arthralgias. Negative for back pain and myalgias.  Skin:  Negative for pallor and rash.  Allergic/Immunologic: Negative for environmental allergies.  Neurological:  Negative for dizziness, syncope and headaches.  Hematological:  Negative for adenopathy. Does not bruise/bleed easily.  Psychiatric/Behavioral:  Negative for decreased concentration and dysphoric mood. The patient is not nervous/anxious.        Objective:   Physical Exam Constitutional:      General: She is not in acute distress.    Appearance: Normal appearance. She is well-developed. She is obese. She is not ill-appearing or diaphoretic.  HENT:     Head: Normocephalic and atraumatic.     Right Ear: Tympanic membrane, ear canal and external ear normal.     Left Ear: Tympanic membrane, ear canal and external ear normal.     Nose: Nose normal. No congestion.     Mouth/Throat:     Mouth:  Mucous membranes are moist.     Pharynx: Oropharynx is clear. No posterior oropharyngeal erythema.  Eyes:     General: No scleral icterus.    Extraocular Movements: Extraocular movements intact.     Conjunctiva/sclera: Conjunctivae normal.     Pupils: Pupils are equal, round, and reactive to light.  Neck:     Thyroid: No thyromegaly.     Vascular: No carotid bruit or JVD.  Cardiovascular:     Rate and Rhythm: Normal rate and regular rhythm.     Pulses: Normal pulses.     Heart sounds: Normal heart sounds.     No gallop.  Pulmonary:     Effort: Pulmonary effort is normal. No respiratory distress.     Breath sounds: Normal breath sounds. No wheezing.     Comments: Good air exch Chest:     Chest wall: No tenderness.  Abdominal:     General: Bowel sounds are normal. There is no distension or abdominal bruit.     Palpations: Abdomen is soft. There is no mass.     Tenderness: There is no abdominal tenderness.     Hernia: No hernia is present.  Genitourinary:    Comments: Breast exam: No mass, nodules, thickening, tenderness, bulging, retraction, inflamation, nipple discharge or skin changes noted.  No axillary or clavicular LA.     Musculoskeletal:        General: No tenderness. Normal range of motion.     Cervical back: Normal range of motion and neck supple. No rigidity. No muscular tenderness.     Right lower leg: No edema.     Left lower leg: No edema.     Comments: No  kyphosis   Limited rom of shoulders   Lymphadenopathy:     Cervical: No cervical adenopathy.  Skin:    General: Skin is warm and dry.     Coloration: Skin is not pale.     Findings: No erythema or rash.     Comments: Solar lentigines diffusely   Neurological:     Mental Status: She is alert. Mental status is at baseline.     Cranial Nerves: No cranial nerve deficit.     Motor: No abnormal muscle tone.     Coordination: Coordination normal.     Gait: Gait normal.     Deep Tendon Reflexes: Reflexes are  normal and symmetric. Reflexes normal.  Psychiatric:        Mood and Affect: Mood normal.        Cognition and Memory: Cognition and memory normal.           Assessment & Plan:   Problem List Items Addressed This Visit       Cardiovascular and Mediastinum   HYPERTENSION, BENIGN ESSENTIAL    bp in fair control at this time  BP Readings from Last 1 Encounters:  12/09/22 131/80  No changes needed Most recent labs reviewed  Disc lifstyle change with low sodium diet and exercise  Plan to continue lisinopril 5 mg daily       Relevant Medications   lisinopril (ZESTRIL) 5 MG tablet     Respiratory   Asthma    Worse living with her son's dog Refilled xymbicort today  Update if not starting to improve in a week or if worsening   Has albuterol for rebound      Relevant Medications   budesonide-formoterol (SYMBICORT) 80-4.5 MCG/ACT inhaler     Endocrine   Hypothyroidism    Hypothyroidism  Pt has no clinical changes No change in energy level/ hair or skin/ edema and no tremor Lab Results  Component Value Date   TSH 0.28 (L) 11/28/2022    Will go down on levothyroxine from 125 to 112 mcg and re check in 6 wk       Relevant Medications   levothyroxine (SYNTHROID) 112 MCG tablet     Musculoskeletal and Integument   Osteopenia    Dexa ordered Pt will call to schedule  No falls or fx  Taking ca and D  Enc her to add more strength training for exercise         Other   Encounter for screening mammogram for breast cancer    Mammogram ordered Pt will call to schedule       Relevant Orders   MM 3D SCREEN BREAST BILATERAL   Estrogen deficiency   Relevant Orders   DG Bone Density   Obesity    Discussed how this problem influences overall health and the risks it imposes  Reviewed plan for weight loss with lower calorie diet (via better food choices and also portion control or program like weight watchers) and exercise building up to or more than 30 minutes 5  days per week including some aerobic activity        Pure hypercholesterolemia    Disc goals for lipids and reasons to control them Rev last labs with pt Rev low sat fat diet in detail  Stable Trig are down LDL Of 147 Declines statin treatment Diet is improved       Relevant Medications   lisinopril (ZESTRIL) 5 MG tablet   Routine general medical examination at a health  care facility - Primary    Reviewed health habits including diet and exercise and skin cancer prevention Reviewed appropriate screening tests for age  Also reviewed health mt list, fam hx and immunization status , as well as social and family history   See HPI Labs reviewed Plans to check on cov of shingrix  Missed flu shot this fall  Mammogram utd 12/2021 - ordered and pt will call to schedule Colonoscopy 11/2020 up to date Dexa 12/2020 - ordered/pt will call to schedule (no falls or fx)       Shoulder pain, bilateral    Ongoing Old injuries Interested in PT

## 2022-12-09 NOTE — Assessment & Plan Note (Signed)
Worse living with her son's dog Refilled xymbicort today  Update if not starting to improve in a week or if worsening   Has albuterol for rebound

## 2022-12-09 NOTE — Assessment & Plan Note (Signed)
Reviewed health habits including diet and exercise and skin cancer prevention Reviewed appropriate screening tests for age  Also reviewed health mt list, fam hx and immunization status , as well as social and family history   See HPI Labs reviewed Plans to check on cov of shingrix  Missed flu shot this fall  Mammogram utd 12/2021 - ordered and pt will call to schedule Colonoscopy 11/2020 up to date Dexa 12/2020 - ordered/pt will call to schedule (no falls or fx)

## 2023-01-07 ENCOUNTER — Other Ambulatory Visit: Payer: Self-pay | Admitting: Family Medicine

## 2023-01-19 ENCOUNTER — Telehealth: Payer: Self-pay | Admitting: Family Medicine

## 2023-01-19 DIAGNOSIS — E890 Postprocedural hypothyroidism: Secondary | ICD-10-CM

## 2023-01-19 NOTE — Telephone Encounter (Signed)
-----   Message from Alvina Chou sent at 01/07/2023  2:28 PM EDT ----- Regarding: Lab orders for Tuesday,4.9.24 Lab orders, thanks

## 2023-01-20 ENCOUNTER — Other Ambulatory Visit (INDEPENDENT_AMBULATORY_CARE_PROVIDER_SITE_OTHER): Payer: Medicare HMO

## 2023-01-20 DIAGNOSIS — E890 Postprocedural hypothyroidism: Secondary | ICD-10-CM | POA: Diagnosis not present

## 2023-01-20 LAB — TSH: TSH: 1.76 u[IU]/mL (ref 0.35–5.50)

## 2023-01-21 ENCOUNTER — Other Ambulatory Visit: Payer: Self-pay

## 2023-01-21 MED ORDER — LEVOTHYROXINE SODIUM 112 MCG PO TABS: 112.0000 ug | ORAL_TABLET | Freq: Every day | ORAL | 3 refills | Status: DC

## 2023-01-21 NOTE — Telephone Encounter (Signed)
See 01/20/23 result note; pt request 90 day rx for levothyroxine 112 mcg. Sent per Dr Lucretia Roers instruction in result note.

## 2023-01-28 ENCOUNTER — Ambulatory Visit
Admission: RE | Admit: 2023-01-28 | Discharge: 2023-01-28 | Disposition: A | Discharge status: Home / Self Care | Payer: Medicare HMO | Source: Ambulatory Visit | Attending: Family Medicine | Admitting: Family Medicine

## 2023-01-28 DIAGNOSIS — Z1231 Encounter for screening mammogram for malignant neoplasm of breast: Secondary | ICD-10-CM

## 2023-01-28 DIAGNOSIS — E2839 Other primary ovarian failure: Secondary | ICD-10-CM | POA: Diagnosis present

## 2023-02-05 ENCOUNTER — Ambulatory Visit: Payer: Medicare HMO | Attending: Family Medicine

## 2023-02-05 DIAGNOSIS — G8929 Other chronic pain: Secondary | ICD-10-CM | POA: Diagnosis present

## 2023-02-05 DIAGNOSIS — M25512 Pain in left shoulder: Secondary | ICD-10-CM | POA: Insufficient documentation

## 2023-02-05 DIAGNOSIS — M25612 Stiffness of left shoulder, not elsewhere classified: Secondary | ICD-10-CM | POA: Insufficient documentation

## 2023-02-05 DIAGNOSIS — M6281 Muscle weakness (generalized): Secondary | ICD-10-CM | POA: Diagnosis present

## 2023-02-05 DIAGNOSIS — M25611 Stiffness of right shoulder, not elsewhere classified: Secondary | ICD-10-CM

## 2023-02-05 DIAGNOSIS — M25511 Pain in right shoulder: Secondary | ICD-10-CM | POA: Diagnosis present

## 2023-02-05 NOTE — Therapy (Signed)
[facility] [address] [place], [address] Phone: [phone]   Fax:  [phone]  Physical Therapy Evaluation  Patient Details  Name: Jodi Steele MRN: [id number] Date of Birth: [birth year] Referring Provider (PT): Tower, Audrie Gallus, MD   Encounter Date: 02/05/2023   PT End of Session - 02/05/23 1419     Visit Number 1    Number of Visits 17    Date for PT Re-Evaluation 04/03/23    PT Start Time 1419    PT Stop Time 1508    PT Time Calculation (min) 49 min    Activity Tolerance Patient limited by pain    Behavior During Therapy [facility] for tasks assessed/performed             Past Medical History:  Diagnosis Date   Asthma    related to pet/dog exposure   Bunion    Colon polyp    Depression    Diffuse cystic mastopathy    Goiter, unspecified    surgery   HTN (hypertension)    Hypothyroidism    Pure hypercholesterolemia     Past Surgical History:  Procedure Laterality Date   BREAST BIOPSY Right 1985   abcsess   BREAST CYST ASPIRATION Left    negative   COLONOSCOPY  11/10   polyp   INCISION AND DRAINAGE BREAST ABSCESS     right   THYROIDECTOMY     TUBAL LIGATION      There were no vitals filed for this visit.    Subjective Assessment - 02/05/23 1421     Subjective R shoulder: 0/10 at rest, 6/10 when returning from end range flexion currently (with a catch), 8/10 at worst for the past 3 months (might be more based on pt expressions). L shoulder: 0/10 at rest, 6/10 when returning from end range flexion (with a catch), 8/10 at worst for the past 3 months.    Pertinent History Chronic pain of both shoulders. R hand dominant. 2014, pt fell and dislocated her R shoulder, had PT, fracture has healed. since then, pt has developed arthritis and has a bone spur in R shoulder. Pain has improved since working in her garden. Also has osteopenia.    Patient Stated Goals Wants to improve her strength, be  able to lift things higher than her waist.    Currently in Pain? Yes    Pain Score 6     Pain Location Shoulder    Pain Orientation Right;Left    Pain Descriptors / Indicators Aching;Sharp   Catching   Pain Type Chronic pain    Pain Onset More than a month ago    Pain Frequency Occasional    Aggravating Factors  Donning and doffing shirts and coats, doing her hair, reaching up, reaching behind (pain in L > R ); R shoulder: eccentric return from flexion causes catching.    Pain Relieving Factors resting position    Effect of Pain on Daily Activities Difficulty lifting things higher than her waist.                [facility] PT Assessment - 02/05/23 1432       Assessment   Medical Diagnosis Chronic pain of both shoulders    Referring Provider (PT) Tower, Audrie Gallus, MD    Onset Date/Surgical Date 12/09/22   Date PT referral signed. Chronic condition   Hand Dominance Right    Prior Therapy Yes      Precautions  Precaution Comments Osteopenia      Restrictions   Other Position/Activity Restrictions No known restrictions      Balance Screen   Has the patient fallen in the past 6 months No    Has the patient had a decrease in activity level because of a fear of falling?  No    Is the patient reluctant to leave their home because of a fear of falling?  No      Posture/Postural Control   Posture Comments forward neck, B protracted shoulder, L scapula slightly more protracted, slight R lumbar lateral shift.      AROM   Overall AROM Comments Crepitus B shoulder with AROM all planes    Right Shoulder Flexion 116 Degrees   with pain, 121 degrees AAROM, stiff end feel, R shoulder IR compensation when returning to resting position; possible painfull arc   Right Shoulder ABduction 82 Degrees   with pain, 94 wiht pain, crepitus.   Right Shoulder Internal Rotation --   Functional IR: R thumb to T8 spinous process   Right Shoulder External Rotation --   Functional ER: R middle finger to L  superior scapular angle   Left Shoulder Flexion 91 Degrees   with pain, 105 AAROM stiff end feel   Left Shoulder ABduction 71 Degrees   with pain 85 AAROM, crepitus   Left Shoulder Internal Rotation --   Functional IR: L thumb to L4 spinous process   Left Shoulder External Rotation --   Functional ER: L middle finger to C6 spinous process     Strength   Overall Strength Comments Manually resisted scapular retraction targeting lower trap muscles 4-/5 R and L    Right Shoulder Flexion 4/5    Right Shoulder ABduction 4/5    Right Shoulder Internal Rotation 4-/5    Right Shoulder External Rotation 3+/5   at available range. Unable to get to end range ER   Left Shoulder Flexion 4/5    Left Shoulder ABduction 4-/5    Left Shoulder Internal Rotation 4+/5    Left Shoulder External Rotation 4/5    Right Elbow Flexion 4-/5    Right Elbow Extension 4/5    Left Elbow Flexion 4/5    Left Elbow Extension 4+/5      Palpation   Palpation comment No TTP B shoulders.      Special Tests   Other special tests No pain wiht empty can R, and L but with weakness, (-) [place] Asal R and L                        Objective measurements completed on examination: See above findings.   Therapeutic exercise  Seated R shoulder ER AAROM with rod 10x5 seconds, towel under R axilla    Improved exercise technique, movement at target joints, use of target muscles after mod verbal, visual, tactile cues.    Response to treatment Pt tolerated session well without aggravation of symptoms.     Clinical impression Pt is a 72 year old female who came to physical therapy secondary to chronic B shoulder pain. She also presents with B shoulder joint crepitus with AROM all planes, limited B shoulder AROM with pain, B shoulder weakness especially with ER, B scapular weakness, altered glenohumeral mechanics, and difficulty performing tasks which involve raising her arms up, lifting, donning and doffing  shirts and coats secondary to weakness, limited ROM, and pain. Pt will benefit from skilled physical therapy services  to address the aforementioned deficits.            Access Code: [email or web address] URL: [email or web address] Date: 02/05/2023 Prepared by: Loralyn Freshwater  Exercises - Seated Shoulder External Rotation AAROM with Dowel  - 2-3 x daily - 7 x weekly - 3 sets - 10 reps - 5 seconds hold              PT Education - 02/05/23 1633     Education Details ther-ex, HEP, POC    Person(s) Educated Patient    Methods Explanation;Demonstration;Tactile cues;Verbal cues;Handout    Comprehension Verbalized understanding;Returned demonstration              PT Short Term Goals - 02/05/23 1531       PT SHORT TERM GOAL #1   Title Pt will be independent with her initial HEP to improve B shoulder AROM, strength, and decrease pain.    Baseline Pt has started her initial HEP (02/05/2023)    Time 3    Period Weeks    Status New    Target Date 02/27/23               PT Long Term Goals - 02/05/23 1533       PT LONG TERM GOAL #1   Title Pt will improve R and L shoulder flexion, abduction AROM by at least 20 degrees to promote ability to reach, as well as lift items with less difficulty.    Baseline Shoulder AROM: flexion 116 degrees R, 91 degrees L, abduction 82 degrees R, 71 degrees L (02/05/2023)    Time 8    Period Weeks    Status New    Target Date 04/03/23      PT LONG TERM GOAL #2   Title Pt will improve B shoulder ER strength by at least 1/2 MMT grade to promote ability to raise her arms up as well as lift with less pain and difficulty.    Baseline Shoulder ER 3+/5 R (at available range), 4/5 L (02/05/2023)    Time 8    Period Weeks    Status New    Target Date 04/03/23      PT LONG TERM GOAL #3   Title Pt will improve her shoulder FOTO score by at least 10 points as a demonstration of improved function.    Baseline Shoulder FOTO 58 (02/05/2023)     Time 8    Period Weeks    Status New    Target Date 04/03/23                    Plan - 02/05/23 1526     Clinical Impression Statement Pt is a 72 year old female who came to physical therapy secondary to chronic B shoulder pain. She also presents with B shoulder joint crepitus with AROM all planes, limited B shoulder AROM with pain, B shoulder weakness especially with ER, B scapular weakness, altered glenohumeral mechanics, and difficulty performing tasks which involve raising her arms up, lifting, donning and doffing shirts and coats secondary to weakness, limited ROM, and pain. Pt will benefit from skilled physical therapy services to address the aforementioned deficits.    Personal Factors and Comorbidities Comorbidity 3+;Age;Fitness;Past/Current Experience;Time since onset of injury/illness/exacerbation    Comorbidities Asthma, depression, HTN    Examination-Activity Limitations Carry;Reach Overhead;Lift;Sleep;Bathing;Dressing    Stability/Clinical Decision Making Stable/Uncomplicated    Clinical Decision Making Low    Rehab Potential Fair    PT Frequency 2x /  week    PT Duration 8 weeks    PT Treatment/Interventions Therapeutic exercise;Therapeutic activities;Neuromuscular re-education;Patient/family education;Manual techniques;Dry needling;Passive range of motion;Aquatic Therapy;Electrical Stimulation;Iontophoresis /ml Dexamethasone    PT Next Visit Plan AAROM, ER strengthening, strengthening throughout range, scapular strength, manual techniques, modalities PRN    PT Home Exercise Plan medbridge Access Code: [email or web address]    Consulted and Agree with Plan of Care Patient             Patient will benefit from skilled therapeutic intervention in order to improve the following deficits and impairments:  Pain, Postural dysfunction, Improper body mechanics, Decreased strength, Decreased range of motion  Visit Diagnosis: Chronic right shoulder pain - Plan: PT plan of care  cert/re-cert  Chronic left shoulder pain - Plan: PT plan of care cert/re-cert  Stiffness of right shoulder joint - Plan: PT plan of care cert/re-cert  Stiffness of left shoulder, not elsewhere classified - Plan: PT plan of care cert/re-cert  Muscle weakness (generalized) - Plan: PT plan of care cert/re-cert     Problem List Patient Active Problem List   Diagnosis Date Noted   Encounter for screening mammogram for breast cancer 12/09/2022   Shoulder pain, bilateral 12/09/2022   Osteopenia 01/18/2018   Estrogen deficiency 09/14/2017   Colon cancer screening 06/27/2015   Encounter for routine gynecological examination 12/10/2012   Obesity 11/28/2011   Routine general medical examination at a health care facility 08/14/2011   Hypothyroidism 09/04/2008   Pure hypercholesterolemia 09/04/2008   HYPERTENSION, BENIGN ESSENTIAL 04/20/2007   DEPRESSION 04/07/2007   Asthma 04/07/2007   FIBROCYSTIC BREAST DISEASE 04/07/2007   BUNION 04/07/2007   Loralyn Freshwater PT, DPT  02/05/2023, 5:35 PM  [facility] [address], [place], [address] Phone: [phone]   Fax:  [phone]  Name: JAMILETH PUTZIER MRN: [id number] Date of Birth: 09-18-1951

## 2023-02-09 ENCOUNTER — Ambulatory Visit: Payer: Medicare HMO

## 2023-02-11 ENCOUNTER — Ambulatory Visit: Payer: Medicare HMO | Attending: Family Medicine

## 2023-02-11 DIAGNOSIS — M25611 Stiffness of right shoulder, not elsewhere classified: Secondary | ICD-10-CM | POA: Insufficient documentation

## 2023-02-11 DIAGNOSIS — M25512 Pain in left shoulder: Secondary | ICD-10-CM | POA: Insufficient documentation

## 2023-02-11 DIAGNOSIS — G8929 Other chronic pain: Secondary | ICD-10-CM | POA: Diagnosis present

## 2023-02-11 DIAGNOSIS — M6281 Muscle weakness (generalized): Secondary | ICD-10-CM | POA: Diagnosis present

## 2023-02-11 DIAGNOSIS — M25511 Pain in right shoulder: Secondary | ICD-10-CM | POA: Insufficient documentation

## 2023-02-11 DIAGNOSIS — M25612 Stiffness of left shoulder, not elsewhere classified: Secondary | ICD-10-CM

## 2023-02-11 NOTE — Therapy (Signed)
OUTPATIENT PHYSICAL THERAPY TREATMENT NOTE   Patient Name: Jodi Steele MRN: [id number] DOB:[birth year], 72 y.o., female Today's Date: 02/11/2023  PCP: Judy Pimple, MD  REFERRING PROVIDER: Judy Pimple, MD   END OF SESSION:  PT End of Session - 02/11/23 1716     Visit Number 2    Number of Visits 17    Date for PT Re-Evaluation 04/03/23    PT Start Time 1716    PT Stop Time 1800    PT Time Calculation (min) 44 min    Activity Tolerance Patient limited by pain    Behavior During Therapy WFL for tasks assessed/performed             Past Medical History:  Diagnosis Date   Asthma    related to pet/dog exposure   Bunion    Colon polyp    Depression    Diffuse cystic mastopathy    Goiter, unspecified    surgery   HTN (hypertension)    Hypothyroidism    Pure hypercholesterolemia    Past Surgical History:  Procedure Laterality Date   BREAST BIOPSY Right 1985   abcsess   BREAST CYST ASPIRATION Left    negative   COLONOSCOPY  11/10   polyp   INCISION AND DRAINAGE BREAST ABSCESS     right   THYROIDECTOMY     TUBAL LIGATION     Patient Active Problem List   Diagnosis Date Noted   Encounter for screening mammogram for breast cancer 12/09/2022   Shoulder pain, bilateral 12/09/2022   Osteopenia 01/18/2018   Estrogen deficiency 09/14/2017   Colon cancer screening 06/27/2015   Encounter for routine gynecological examination 12/10/2012   Obesity 11/28/2011   Routine general medical examination at a health care facility 08/14/2011   Hypothyroidism 09/04/2008   Pure hypercholesterolemia 09/04/2008   HYPERTENSION, BENIGN ESSENTIAL 04/20/2007   DEPRESSION 04/07/2007   Asthma 04/07/2007   FIBROCYSTIC BREAST DISEASE 04/07/2007   BUNION 04/07/2007    REFERRING DIAG: Chronic pain of both shoulders   THERAPY DIAG:  Chronic right shoulder pain  Chronic left shoulder pain  Stiffness of right shoulder joint  Stiffness of left shoulder, not elsewhere  classified  Muscle weakness (generalized)  Rationale for Evaluation and Treatment Rehabilitation  PERTINENT HISTORY: Chronic pain of both shoulders. R hand dominant. 2014, pt fell and dislocated her R shoulder, had PT, fracture has healed. since then, pt has developed arthritis and has a bone spur in R shoulder. Pain has improved since working in her garden. Also has osteopenia.   PRECAUTIONS: Osteopenia   SUBJECTIVE:   SUBJECTIVE STATEMENT: Shoulder are ok.   PAIN:  Are you having pain? See subjective   TODAY'S TREATMENT:  DATE: 02/11/2023  Therapeutic exercise   Seated shoulder ER AAROM with rod towel under axilla  R 10x5 seconds for 3 sets  L 10x5 seconds for 3 sets   Standing shoulder isometrics  Flexion    R 10x5 seconds for 2 sets   L 10x5 seconds for 2 sets   Abduction    R 10x5 seconds for 2 sets   L 10x5 seconds for 2 sets   Triceps extension at table   R 10x5 seconds for 2 sets  L 10x5 seconds for 2 sets    Improved exercise technique, movement at target joints, use of target muscles after mod verbal, visual, tactile cues.      Response to treatment Pt tolerated session well without aggravation of symptoms. Pt states both shoulders do not hurt and feel more loose after session.        Clinical impression Worked on improving muscle activation B shoulders to control intraarticular movement, targeting the infraspinatus and teres minor, shoulder flexors, abductors, and triceps. Pt tolerated session well without aggravation of symptoms. Pt states both shoulders do not hurt and feel more loose after session. Pt will benefit from continued skilled physical therapy services to decrease pain, improve AROM, strength, and function.      PATIENT EDUCATION: Education details: there-ex, HEP Person educated: Patient Education  method: Explanation, Demonstration, Tactile cues, Verbal cues, and Handouts Education comprehension: verbalized understanding and returned demonstration  HOME EXERCISE PROGRAM: Access Code: [email or web address] URL: [email or web address] Date: 02/05/2023 Prepared by: Loralyn Freshwater   Exercises - Seated Shoulder External Rotation AAROM with Dowel  - 2-3 x daily - 7 x weekly - 3 sets - 10 reps - 5 seconds hold  - Isometric Shoulder Flexion at Wall  - 1 x daily - 7 x weekly - 3 sets - 10 reps -  5 seconds hold - Isometric Shoulder Abduction - Arm Straight at Wall  - 1 x daily - 7 x weekly - 3 sets - 10 reps - 5 seconds hold  - Isometric Tricep Extension   - 1 x daily - 7 x weekly - 3 sets - 10 reps - 5 seconds hold   PT Short Term Goals - 02/05/23 1531       PT SHORT TERM GOAL #1   Title Pt will be independent with her initial HEP to improve B shoulder AROM, strength, and decrease pain.    Baseline Pt has started her initial HEP (02/05/2023)    Time 3    Period Weeks    Status New    Target Date 02/27/23              PT Long Term Goals - 02/05/23 1533       PT LONG TERM GOAL #1   Title Pt will improve R and L shoulder flexion, abduction AROM by at least 20 degrees to promote ability to reach, as well as lift items with less difficulty.    Baseline Shoulder AROM: flexion 116 degrees R, 91 degrees L, abduction 82 degrees R, 71 degrees L (02/05/2023)    Time 8    Period Weeks    Status New    Target Date 04/03/23      PT LONG TERM GOAL #2   Title Pt will improve B shoulder ER strength by at least 1/2 MMT grade to promote ability to raise her arms up as well as lift with less pain and difficulty.    Baseline Shoulder ER 3+/5 R (at available range),  4/5 L (02/05/2023)    Time 8    Period Weeks    Status New    Target Date 04/03/23      PT LONG TERM GOAL #3   Title Pt will improve her shoulder FOTO score by at least 10 points as a demonstration of improved function.     Baseline Shoulder FOTO 58 (02/05/2023)    Time 8    Period Weeks    Status New    Target Date 04/03/23              Plan - 02/11/23 1710     Clinical Impression Statement Worked on improving muscle activation B shoulders to control intraarticular movement, targeting the infraspinatus and teres minor, shoulder flexors, abductors, and triceps. Pt tolerated session well without aggravation of symptoms. Pt states both shoulders do not hurt and feel more loose after session. Pt will benefit from continued skilled physical therapy services to decrease pain, improve AROM, strength, and function.    Personal Factors and Comorbidities Comorbidity 3+;Age;Fitness;Past/Current Experience;Time since onset of injury/illness/exacerbation    Comorbidities Asthma, depression, HTN    Examination-Activity Limitations Carry;Reach Overhead;Lift;Sleep;Bathing;Dressing    Stability/Clinical Decision Making Stable/Uncomplicated    Rehab Potential Fair    PT Frequency 2x / week    PT Duration 8 weeks    PT Treatment/Interventions Therapeutic exercise;Therapeutic activities;Neuromuscular re-education;Patient/family education;Manual techniques;Dry needling;Passive range of motion;Aquatic Therapy;Electrical Stimulation;Iontophoresis 4mg /ml Dexamethasone    PT Next Visit Plan AAROM, ER strengthening, strengthening throughout range, scapular strength, manual techniques, modalities PRN    PT Home Exercise Plan medbridge Access Code: [email or web address]    Consulted and Agree with Plan of Care Patient              Loralyn Freshwater PT, DPT  02/11/2023, 6:08 PM

## 2023-02-23 ENCOUNTER — Other Ambulatory Visit: Payer: Self-pay | Admitting: Family Medicine

## 2023-02-25 ENCOUNTER — Ambulatory Visit: Payer: Medicare HMO

## 2023-02-25 DIAGNOSIS — M25612 Stiffness of left shoulder, not elsewhere classified: Secondary | ICD-10-CM

## 2023-02-25 DIAGNOSIS — M6281 Muscle weakness (generalized): Secondary | ICD-10-CM

## 2023-02-25 DIAGNOSIS — M25511 Pain in right shoulder: Secondary | ICD-10-CM | POA: Diagnosis not present

## 2023-02-25 DIAGNOSIS — M25611 Stiffness of right shoulder, not elsewhere classified: Secondary | ICD-10-CM

## 2023-02-25 DIAGNOSIS — G8929 Other chronic pain: Secondary | ICD-10-CM

## 2023-02-25 NOTE — Therapy (Signed)
OUTPATIENT PHYSICAL THERAPY TREATMENT NOTE   Patient Name: Jodi Steele MRN: [id number] DOB:[birth year], 72 y.o., female Today's Date: 02/25/2023  PCP: Judy Pimple, MD  REFERRING PROVIDER: Judy Pimple, MD   END OF SESSION:  PT End of Session - 02/25/23 1348     Visit Number 3    Number of Visits 17    Date for PT Re-Evaluation 04/03/23    PT Start Time 1349    PT Stop Time 1435    PT Time Calculation (min) 46 min    Activity Tolerance Patient limited by pain    Behavior During Therapy WFL for tasks assessed/performed              Past Medical History:  Diagnosis Date   Asthma    related to pet/dog exposure   Bunion    Colon polyp    Depression    Diffuse cystic mastopathy    Goiter, unspecified    surgery   HTN (hypertension)    Hypothyroidism    Pure hypercholesterolemia    Past Surgical History:  Procedure Laterality Date   BREAST BIOPSY Right 1985   abcsess   BREAST CYST ASPIRATION Left    negative   COLONOSCOPY  11/10   polyp   INCISION AND DRAINAGE BREAST ABSCESS     right   THYROIDECTOMY     TUBAL LIGATION     Patient Active Problem List   Diagnosis Date Noted   Encounter for screening mammogram for breast cancer 12/09/2022   Shoulder pain, bilateral 12/09/2022   Osteopenia 01/18/2018   Estrogen deficiency 09/14/2017   Colon cancer screening 06/27/2015   Encounter for routine gynecological examination 12/10/2012   Obesity 11/28/2011   Routine general medical examination at a health care facility 08/14/2011   Hypothyroidism 09/04/2008   Pure hypercholesterolemia 09/04/2008   HYPERTENSION, BENIGN ESSENTIAL 04/20/2007   DEPRESSION 04/07/2007   Asthma 04/07/2007   FIBROCYSTIC BREAST DISEASE 04/07/2007   BUNION 04/07/2007    REFERRING DIAG: Chronic pain of both shoulders   THERAPY DIAG:  Chronic right shoulder pain  Chronic left shoulder pain  Stiffness of left shoulder, not elsewhere classified  Muscle weakness  (generalized)  Stiffness of right shoulder joint  Rationale for Evaluation and Treatment Rehabilitation  PERTINENT HISTORY: Chronic pain of both shoulders. R hand dominant. 2014, pt fell and dislocated her R shoulder, had PT, fracture has healed. since then, pt has developed arthritis and has a bone spur in R shoulder. Pain has improved since working in her garden. Also has osteopenia.   PRECAUTIONS: Osteopenia   SUBJECTIVE:   SUBJECTIVE STATEMENT: Shoulder are fine right now. No pain at rest.   PAIN:  Are you having pain? See subjective   TODAY'S TREATMENT:  DATE: 02/25/2023  Therapeutic exercise    Seated shoulder ER AAROM to end range with PT isometric manual resistance at end range   R 10x5 seconds for 2 sets  L 10x5 seconds for 2 sets   Standing shoulder isometrics  Flexion    R 10x5 seconds for 2 sets   L 10x5 seconds for 2 sets   Abduction    R 10x5 seconds for 2 sets   L 10x5 seconds for 2 sets  Standing AAROM with SPC   Flexion    R 10x5 seconds    L 10x5 secodns     With PT resistance to return to extension     Decreased L shoulder pain   Seated table slides AAROM with pt pressing against table during the return motion to activate posterior shoulder muscles to promote posterior and inferior B humeral head glide   Flexion 10x5 seconds Bilaterally      Improved exercise technique, movement at target joints, use of target muscles after mod verbal, visual, tactile cues.      Response to treatment Pt tolerated session well without aggravation of symptoms.        Clinical impression Decreased L shoulder pain when returning from end range flexion with activation of posterior shoulder muscles. Continued working on improving muscle activation B shoulders to control intraarticular movement, targeting the infraspinatus and  teres minor, shoulder flexors, abductors, posterior deltoid and triceps. Pt tolerated session well without aggravation of symptoms. Pt will benefit from continued skilled physical therapy services to decrease pain, improve AROM, strength, and function.      PATIENT EDUCATION: Education details: there-ex, HEP Person educated: Patient Education method: Explanation, Demonstration, Tactile cues, Verbal cues, and Handouts Education comprehension: verbalized understanding and returned demonstration  HOME EXERCISE PROGRAM: Access Code: [email or web address] URL: [email or web address] Date: 02/05/2023 Prepared by: Loralyn Freshwater   Exercises - Seated Shoulder External Rotation AAROM with Dowel  - 2-3 x daily - 7 x weekly - 3 sets - 10 reps - 5 seconds hold  - Isometric Shoulder Flexion at Wall  - 1 x daily - 7 x weekly - 3 sets - 10 reps -  5 seconds hold - Isometric Shoulder Abduction - Arm Straight at Wall  - 1 x daily - 7 x weekly - 3 sets - 10 reps - 5 seconds hold  - Isometric Tricep Extension   - 1 x daily - 7 x weekly - 3 sets - 10 reps - 5 seconds hold   PT Short Term Goals - 02/05/23 1531       PT SHORT TERM GOAL #1   Title Pt will be independent with her initial HEP to improve B shoulder AROM, strength, and decrease pain.    Baseline Pt has started her initial HEP (02/05/2023)    Time 3    Period Weeks    Status New    Target Date 02/27/23              PT Long Term Goals - 02/05/23 1533       PT LONG TERM GOAL #1   Title Pt will improve R and L shoulder flexion, abduction AROM by at least 20 degrees to promote ability to reach, as well as lift items with less difficulty.    Baseline Shoulder AROM: flexion 116 degrees R, 91 degrees L, abduction 82 degrees R, 71 degrees L (02/05/2023)    Time 8    Period Weeks    Status New    Target  Date 04/03/23      PT LONG TERM GOAL #2   Title Pt will improve B shoulder ER strength by at least 1/2 MMT grade to promote ability to  raise her arms up as well as lift with less pain and difficulty.    Baseline Shoulder ER 3+/5 R (at available range), 4/5 L (02/05/2023)    Time 8    Period Weeks    Status New    Target Date 04/03/23      PT LONG TERM GOAL #3   Title Pt will improve her shoulder FOTO score by at least 10 points as a demonstration of improved function.    Baseline Shoulder FOTO 58 (02/05/2023)    Time 8    Period Weeks    Status New    Target Date 04/03/23              Plan - 02/25/23 1348     Clinical Impression Statement Decreased L shoulder pain when returning from end range flexion with activation of posterior shoulder muscles. Continued working on improving muscle activation B shoulders to control intraarticular movement, targeting the infraspinatus and teres minor, shoulder flexors, abductors, posterior deltoid and triceps. Pt tolerated session well without aggravation of symptoms. Pt will benefit from continued skilled physical therapy services to decrease pain, improve AROM, strength, and function.    Personal Factors and Comorbidities Comorbidity 3+;Age;Fitness;Past/Current Experience;Time since onset of injury/illness/exacerbation    Comorbidities Asthma, depression, HTN    Examination-Activity Limitations Carry;Reach Overhead;Lift;Sleep;Bathing;Dressing    Stability/Clinical Decision Making Stable/Uncomplicated    Rehab Potential Fair    PT Frequency 2x / week    PT Duration 8 weeks    PT Treatment/Interventions Therapeutic exercise;Therapeutic activities;Neuromuscular re-education;Patient/family education;Manual techniques;Dry needling;Passive range of motion;Aquatic Therapy;Electrical Stimulation;Iontophoresis 4mg /ml Dexamethasone    PT Next Visit Plan AAROM, ER strengthening, strengthening throughout range, scapular strength, manual techniques, modalities PRN    PT Home Exercise Plan medbridge Access Code: [email or web address]    Consulted and Agree with Plan of Care Patient               Loralyn Freshwater PT, DPT  02/25/2023, 4:27 PM

## 2023-03-04 ENCOUNTER — Ambulatory Visit: Payer: Medicare HMO

## 2023-03-04 DIAGNOSIS — M25511 Pain in right shoulder: Secondary | ICD-10-CM | POA: Diagnosis not present

## 2023-03-04 DIAGNOSIS — M25612 Stiffness of left shoulder, not elsewhere classified: Secondary | ICD-10-CM

## 2023-03-04 DIAGNOSIS — G8929 Other chronic pain: Secondary | ICD-10-CM

## 2023-03-04 DIAGNOSIS — M6281 Muscle weakness (generalized): Secondary | ICD-10-CM

## 2023-03-04 DIAGNOSIS — M25611 Stiffness of right shoulder, not elsewhere classified: Secondary | ICD-10-CM

## 2023-03-04 NOTE — Therapy (Signed)
OUTPATIENT PHYSICAL THERAPY TREATMENT NOTE   Patient Name: Jodi Steele MRN: [id number] DOB:18-Oct-1950, 72 y.o., female Today's Date: 03/04/2023  PCP: Judy Pimple, MD  REFERRING PROVIDER: Judy Pimple, MD   END OF SESSION:  PT End of Session - 03/04/23 1436     Visit Number 4    Number of Visits 17    Date for PT Re-Evaluation 04/03/23    PT Start Time 1436    PT Stop Time 1516    PT Time Calculation (min) 40 min    Activity Tolerance Patient limited by pain    Behavior During Therapy WFL for tasks assessed/performed               Past Medical History:  Diagnosis Date   Asthma    related to pet/dog exposure   Bunion    Colon polyp    Depression    Diffuse cystic mastopathy    Goiter, unspecified    surgery   HTN (hypertension)    Hypothyroidism    Pure hypercholesterolemia    Past Surgical History:  Procedure Laterality Date   BREAST BIOPSY Right 1985   abcsess   BREAST CYST ASPIRATION Left    negative   COLONOSCOPY  11/10   polyp   INCISION AND DRAINAGE BREAST ABSCESS     right   THYROIDECTOMY     TUBAL LIGATION     Patient Active Problem List   Diagnosis Date Noted   Encounter for screening mammogram for breast cancer 12/09/2022   Shoulder pain, bilateral 12/09/2022   Osteopenia 01/18/2018   Estrogen deficiency 09/14/2017   Colon cancer screening 06/27/2015   Encounter for routine gynecological examination 12/10/2012   Obesity 11/28/2011   Routine general medical examination at a health care facility 08/14/2011   Hypothyroidism 09/04/2008   Pure hypercholesterolemia 09/04/2008   HYPERTENSION, BENIGN ESSENTIAL 04/20/2007   DEPRESSION 04/07/2007   Asthma 04/07/2007   FIBROCYSTIC BREAST DISEASE 04/07/2007   BUNION 04/07/2007    REFERRING DIAG: Chronic pain of both shoulders   THERAPY DIAG:  Chronic right shoulder pain  Chronic left shoulder pain  Stiffness of left shoulder, not elsewhere classified  Muscle weakness  (generalized)  Stiffness of right shoulder joint  Rationale for Evaluation and Treatment Rehabilitation  PERTINENT HISTORY: Chronic pain of both shoulders. R hand dominant. 2014, pt fell and dislocated her R shoulder, had PT, fracture has healed. since then, pt has developed arthritis and has a bone spur in R shoulder. Pain has improved since working in her garden. Also has osteopenia.   PRECAUTIONS: Osteopenia   SUBJECTIVE:   SUBJECTIVE STATEMENT: Has been doing a lot of yard work.  No R shoulder soreness when raising it up currently. No pain in L shoulder when raising it up, 4/10 L shoulder pain when lowering it. Took Aleve this morning which helps. Can reach better. Still has difficulty fixing her hair.   PAIN:  Are you having pain? See subjective   TODAY'S TREATMENT:  DATE: 03/04/2023  Therapeutic exercise   Seated table slides AAROM with pt pressing against table during the return motion to activate posterior shoulder muscles to promote posterior and inferior B humeral head glide   Flexion 10x5 seconds for 3 sets Bilaterally   Seated shoulder ER AAROM to end range with PT isometric manual resistance at end range   R 10x5 seconds for 2 sets  L 10x5 seconds for 2 sets  Seated shoulder flexion with PT resistance to extension   R 3x. R shoulder crepitus.   L 3x. L shoulder crepitus   R shoulder extension from end range flexion   Green band providing lateral distraction (transitioned to PT lateral pull with towel for comfort instead of band)   R shoulder extension with red band 10x2   No R shoulder crepitus with lateral distraction of humeral head.          Improved exercise technique, movement at target joints, use of target muscles after mod verbal, visual, tactile cues.      Response to treatment Fair tolerance to today's session.         Clinical impression Continued working on improving posterior shoulder and ER muscle strength to promote better glenohumeral mechanics. Decreased R shoulder crepitus with R shoulder flexion and return motion with addition of lateral glide to R humeral head. Fair tolerance to today's session.  Pt will benefit from continued skilled physical therapy services to decrease pain, improve AROM, strength, and function.      PATIENT EDUCATION: Education details: there-ex, HEP Person educated: Patient Education method: Explanation, Demonstration, Tactile cues, Verbal cues, and Handouts Education comprehension: verbalized understanding and returned demonstration  HOME EXERCISE PROGRAM: Access Code: [email or web address] URL: [email or web address] Date: 02/05/2023 Prepared by: Loralyn Freshwater   Exercises - Seated Shoulder External Rotation AAROM with Dowel  - 2-3 x daily - 7 x weekly - 3 sets - 10 reps - 5 seconds hold  - Isometric Shoulder Flexion at Wall  - 1 x daily - 7 x weekly - 3 sets - 10 reps -  5 seconds hold - Isometric Shoulder Abduction - Arm Straight at Wall  - 1 x daily - 7 x weekly - 3 sets - 10 reps - 5 seconds hold  - Isometric Tricep Extension   - 1 x daily - 7 x weekly - 3 sets - 10 reps - 5 seconds hold   PT Short Term Goals - 02/05/23 1531       PT SHORT TERM GOAL #1   Title Pt will be independent with her initial HEP to improve B shoulder AROM, strength, and decrease pain.    Baseline Pt has started her initial HEP (02/05/2023)    Time 3    Period Weeks    Status New    Target Date 02/27/23              PT Long Term Goals - 02/05/23 1533       PT LONG TERM GOAL #1   Title Pt will improve R and L shoulder flexion, abduction AROM by at least 20 degrees to promote ability to reach, as well as lift items with less difficulty.    Baseline Shoulder AROM: flexion 116 degrees R, 91 degrees L, abduction 82 degrees R, 71 degrees L (02/05/2023)    Time 8    Period  Weeks    Status New    Target Date 04/03/23      PT LONG TERM GOAL #2   Title  Pt will improve B shoulder ER strength by at least 1/2 MMT grade to promote ability to raise her arms up as well as lift with less pain and difficulty.    Baseline Shoulder ER 3+/5 R (at available range), 4/5 L (02/05/2023)    Time 8    Period Weeks    Status New    Target Date 04/03/23      PT LONG TERM GOAL #3   Title Pt will improve her shoulder FOTO score by at least 10 points as a demonstration of improved function.    Baseline Shoulder FOTO 58 (02/05/2023)    Time 8    Period Weeks    Status New    Target Date 04/03/23              Plan - 03/04/23 1435     Clinical Impression Statement Continued working on improving posterior shoulder and ER muscle strength to promote better glenohumeral mechanics. Decreased R shoulder crepitus with R shoulder flexion and return motion with addition of lateral glide to R humeral head. Fair tolerance to today's session.  Pt will benefit from continued skilled physical therapy services to decrease pain, improve AROM, strength, and function.    Personal Factors and Comorbidities Comorbidity 3+;Age;Fitness;Past/Current Experience;Time since onset of injury/illness/exacerbation    Comorbidities Asthma, depression, HTN    Examination-Activity Limitations Carry;Reach Overhead;Lift;Sleep;Bathing;Dressing    Stability/Clinical Decision Making Stable/Uncomplicated    Rehab Potential Fair    PT Frequency 2x / week    PT Duration 8 weeks    PT Treatment/Interventions Therapeutic exercise;Therapeutic activities;Neuromuscular re-education;Patient/family education;Manual techniques;Dry needling;Passive range of motion;Aquatic Therapy;Electrical Stimulation;Iontophoresis 4mg /ml Dexamethasone    PT Next Visit Plan AAROM, ER strengthening, strengthening throughout range, scapular strength, manual techniques, modalities PRN    PT Home Exercise Plan medbridge Access Code: [email or web address]     Consulted and Agree with Plan of Care Patient               Loralyn Freshwater PT, DPT  03/04/2023, 3:25 PM

## 2023-03-11 ENCOUNTER — Ambulatory Visit: Payer: Medicare HMO

## 2023-03-11 DIAGNOSIS — M25612 Stiffness of left shoulder, not elsewhere classified: Secondary | ICD-10-CM

## 2023-03-11 DIAGNOSIS — M25511 Pain in right shoulder: Secondary | ICD-10-CM | POA: Diagnosis not present

## 2023-03-11 DIAGNOSIS — M25611 Stiffness of right shoulder, not elsewhere classified: Secondary | ICD-10-CM

## 2023-03-11 DIAGNOSIS — M6281 Muscle weakness (generalized): Secondary | ICD-10-CM

## 2023-03-11 DIAGNOSIS — G8929 Other chronic pain: Secondary | ICD-10-CM

## 2023-03-11 NOTE — Therapy (Signed)
OUTPATIENT PHYSICAL THERAPY TREATMENT NOTE   Patient Name: Jodi Steele MRN: [id number] DOB:10-08-1951, 72 y.o., female Today's Date: 03/11/2023  PCP: Judy Pimple, MD  REFERRING PROVIDER: Judy Pimple, MD   END OF SESSION:  PT End of Session - 03/11/23 1351     Visit Number 5    Number of Visits 17    Date for PT Re-Evaluation 04/03/23    PT Start Time 1350    PT Stop Time 1430    PT Time Calculation (min) 40 min    Activity Tolerance Patient limited by pain    Behavior During Therapy WFL for tasks assessed/performed                Past Medical History:  Diagnosis Date   Asthma    related to pet/dog exposure   Bunion    Colon polyp    Depression    Diffuse cystic mastopathy    Goiter, unspecified    surgery   HTN (hypertension)    Hypothyroidism    Pure hypercholesterolemia    Past Surgical History:  Procedure Laterality Date   BREAST BIOPSY Right 1985   abcsess   BREAST CYST ASPIRATION Left    negative   COLONOSCOPY  11/10   polyp   INCISION AND DRAINAGE BREAST ABSCESS     right   THYROIDECTOMY     TUBAL LIGATION     Patient Active Problem List   Diagnosis Date Noted   Encounter for screening mammogram for breast cancer 12/09/2022   Shoulder pain, bilateral 12/09/2022   Osteopenia 01/18/2018   Estrogen deficiency 09/14/2017   Colon cancer screening 06/27/2015   Encounter for routine gynecological examination 12/10/2012   Obesity 11/28/2011   Routine general medical examination at a health care facility 08/14/2011   Hypothyroidism 09/04/2008   Pure hypercholesterolemia 09/04/2008   HYPERTENSION, BENIGN ESSENTIAL 04/20/2007   DEPRESSION 04/07/2007   Asthma 04/07/2007   FIBROCYSTIC BREAST DISEASE 04/07/2007   BUNION 04/07/2007    REFERRING DIAG: Chronic pain of both shoulders   THERAPY DIAG:  Chronic right shoulder pain  Chronic left shoulder pain  Stiffness of left shoulder, not elsewhere classified  Muscle weakness  (generalized)  Stiffness of right shoulder joint  Rationale for Evaluation and Treatment Rehabilitation  PERTINENT HISTORY: Chronic pain of both shoulders. R hand dominant. 2014, pt fell and dislocated her R shoulder, had PT, fracture has healed. since then, pt has developed arthritis and has a bone spur in R shoulder. Pain has improved since working in her garden. Also has osteopenia.   PRECAUTIONS: Osteopenia    No latex allergies    SUBJECTIVE:   SUBJECTIVE STATEMENT: Shoulders are fine-ish. 3/10 R, 4-5/10 L shoulder pain currently. R side is doing better than the L. Has also been doing a lot of pruning, hauling, weeding, sawing, push mowing, yardwork. Did not take ibuprofen or aleve today. Took them last time before PT.     PAIN:  Are you having pain? See subjective   TODAY'S TREATMENT:  DATE: 03/11/2023  Therapeutic exercise    shoulder extension from end range flexion     L shoulder extension red band 10x3   R shoulder extension with red band 10x2   Decreased crepitus compared to last session, not needing lateral distraction of humeral head today.   Wall push-ups for triceps strengthening 4x  R superior lateral shoulder and arm discomfort.   Seated shoulder ER AAROM to end range with PT isometric manual resistance at end range   R 10x5 seconds for 3 sets  L 10x5 seconds for 3 sets  Seated manually resisted shoulder extension from end range flexion  L 10x2  Cues for scapular control    Seated manually resisted triceps extension   R 10x5 seconds for 2 sets    Improved exercise technique, movement at target joints, use of target muscles after mod verbal, visual, tactile cues.      Response to treatment Decreased L shoulder pain to 2/10 and 1/10 R  shoulder pain after treatment        Clinical impression Continued  working on improving posterior shoulder and ER muscle strength as well as scapular mechanics to promote better glenohumeral movement. Decreased L shoulder pain to 2/10 and 1/10 R  shoulder pain after treatment. Pt will benefit from continued skilled physical therapy services to decrease pain, improve AROM, strength, and function.      PATIENT EDUCATION: Education details: there-ex, HEP Person educated: Patient Education method: Explanation, Demonstration, Tactile cues, Verbal cues, and Handouts Education comprehension: verbalized understanding and returned demonstration  HOME EXERCISE PROGRAM: Access Code: [email or web address] URL: [email or web address] Date: 02/05/2023 Prepared by: Loralyn Freshwater   Exercises - Seated Shoulder External Rotation AAROM with Dowel  - 2-3 x daily - 7 x weekly - 3 sets - 10 reps - 5 seconds hold  - Isometric Shoulder Flexion at Wall  - 1 x daily - 7 x weekly - 3 sets - 10 reps -  5 seconds hold - Isometric Shoulder Abduction - Arm Straight at Wall  - 1 x daily - 7 x weekly - 3 sets - 10 reps - 5 seconds hold  - Isometric Tricep Extension   - 1 x daily - 7 x weekly - 3 sets - 10 reps - 5 seconds hold  - Single Arm Shoulder Extension with Anchored Resistance  - 1 x daily - 7 x weekly - 3 sets - 10 reps  Red band    PT Short Term Goals - 02/05/23 1531       PT SHORT TERM GOAL #1   Title Pt will be independent with her initial HEP to improve B shoulder AROM, strength, and decrease pain.    Baseline Pt has started her initial HEP (02/05/2023)    Time 3    Period Weeks    Status New    Target Date 02/27/23              PT Long Term Goals - 02/05/23 1533       PT LONG TERM GOAL #1   Title Pt will improve R and L shoulder flexion, abduction AROM by at least 20 degrees to promote ability to reach, as well as lift items with less difficulty.    Baseline Shoulder AROM: flexion 116 degrees R, 91 degrees L, abduction 82 degrees R, 71 degrees L  (02/05/2023)    Time 8    Period Weeks    Status New    Target Date 04/03/23  PT LONG TERM GOAL #2   Title Pt will improve B shoulder ER strength by at least 1/2 MMT grade to promote ability to raise her arms up as well as lift with less pain and difficulty.    Baseline Shoulder ER 3+/5 R (at available range), 4/5 L (02/05/2023)    Time 8    Period Weeks    Status New    Target Date 04/03/23      PT LONG TERM GOAL #3   Title Pt will improve her shoulder FOTO score by at least 10 points as a demonstration of improved function.    Baseline Shoulder FOTO 58 (02/05/2023)    Time 8    Period Weeks    Status New    Target Date 04/03/23              Plan - 03/11/23 1350     Clinical Impression Statement Continued working on improving posterior shoulder and ER muscle strength as well as scapular mechanics to promote better glenohumeral movement. Decreased L shoulder pain to 2/10 and 1/10 R  shoulder pain after treatment. Pt will benefit from continued skilled physical therapy services to decrease pain, improve AROM, strength, and function.    Personal Factors and Comorbidities Comorbidity 3+;Age;Fitness;Past/Current Experience;Time since onset of injury/illness/exacerbation    Comorbidities Asthma, depression, HTN    Examination-Activity Limitations Carry;Reach Overhead;Lift;Sleep;Bathing;Dressing    Stability/Clinical Decision Making Stable/Uncomplicated    Rehab Potential Fair    PT Frequency 2x / week    PT Duration 8 weeks    PT Treatment/Interventions Therapeutic exercise;Therapeutic activities;Neuromuscular re-education;Patient/family education;Manual techniques;Dry needling;Passive range of motion;Aquatic Therapy;Electrical Stimulation;Iontophoresis 4mg /ml Dexamethasone    PT Next Visit Plan AAROM, ER strengthening, strengthening throughout range, scapular strength, manual techniques, modalities PRN    PT Home Exercise Plan medbridge Access Code: [email or web address]    Consulted and  Agree with Plan of Care Patient               Loralyn Freshwater PT, DPT  03/11/2023, 2:41 PM

## 2023-03-18 ENCOUNTER — Ambulatory Visit: Payer: Medicare HMO | Attending: Family Medicine

## 2023-03-18 DIAGNOSIS — G8929 Other chronic pain: Secondary | ICD-10-CM | POA: Insufficient documentation

## 2023-03-18 DIAGNOSIS — M25612 Stiffness of left shoulder, not elsewhere classified: Secondary | ICD-10-CM | POA: Insufficient documentation

## 2023-03-18 DIAGNOSIS — M25512 Pain in left shoulder: Secondary | ICD-10-CM | POA: Diagnosis present

## 2023-03-18 DIAGNOSIS — M6281 Muscle weakness (generalized): Secondary | ICD-10-CM | POA: Diagnosis present

## 2023-03-18 DIAGNOSIS — M25611 Stiffness of right shoulder, not elsewhere classified: Secondary | ICD-10-CM | POA: Diagnosis present

## 2023-03-18 DIAGNOSIS — M25511 Pain in right shoulder: Secondary | ICD-10-CM | POA: Diagnosis present

## 2023-03-18 NOTE — Therapy (Signed)
OUTPATIENT PHYSICAL THERAPY TREATMENT NOTE   Patient Name: Jodi Steele MRN: [id number] DOB:December 23, 1950, 72 y.o., female Today's Date: 03/18/2023  PCP: Judy Pimple, MD  REFERRING PROVIDER: Judy Pimple, MD   END OF SESSION:  PT End of Session - 03/18/23 1351     Visit Number 6    Number of Visits 17    Date for PT Re-Evaluation 04/03/23    PT Start Time 1351    PT Stop Time 1431    PT Time Calculation (min) 40 min    Activity Tolerance Patient limited by pain    Behavior During Therapy WFL for tasks assessed/performed                 Past Medical History:  Diagnosis Date   Asthma    related to pet/dog exposure   Bunion    Colon polyp    Depression    Diffuse cystic mastopathy    Goiter, unspecified    surgery   HTN (hypertension)    Hypothyroidism    Pure hypercholesterolemia    Past Surgical History:  Procedure Laterality Date   BREAST BIOPSY Right 1985   abcsess   BREAST CYST ASPIRATION Left    negative   COLONOSCOPY  11/10   polyp   INCISION AND DRAINAGE BREAST ABSCESS     right   THYROIDECTOMY     TUBAL LIGATION     Patient Active Problem List   Diagnosis Date Noted   Encounter for screening mammogram for breast cancer 12/09/2022   Shoulder pain, bilateral 12/09/2022   Osteopenia 01/18/2018   Estrogen deficiency 09/14/2017   Colon cancer screening 06/27/2015   Encounter for routine gynecological examination 12/10/2012   Obesity 11/28/2011   Routine general medical examination at a health care facility 08/14/2011   Hypothyroidism 09/04/2008   Pure hypercholesterolemia 09/04/2008   HYPERTENSION, BENIGN ESSENTIAL 04/20/2007   DEPRESSION 04/07/2007   Asthma 04/07/2007   FIBROCYSTIC BREAST DISEASE 04/07/2007   BUNION 04/07/2007    REFERRING DIAG: Chronic pain of both shoulders   THERAPY DIAG:  Chronic right shoulder pain  Chronic left shoulder pain  Stiffness of left shoulder, not elsewhere classified  Muscle weakness  (generalized)  Stiffness of right shoulder joint  Rationale for Evaluation and Treatment Rehabilitation  PERTINENT HISTORY: Chronic pain of both shoulders. R hand dominant. 2014, pt fell and dislocated her R shoulder, had PT, fracture has healed. since then, pt has developed arthritis and has a bone spur in R shoulder. Pain has improved since working in her garden. Also has osteopenia.   PRECAUTIONS: Osteopenia    No latex allergies    SUBJECTIVE:   SUBJECTIVE STATEMENT: Shoulders are doing ok. Has been doing so much yard work that it's hard to tell. No pain currently at rest. R shoulder 3/10 with flexion, L shoulder 3/10 with flexion.  Maybe 5/10 R shoulder and 6/10 L shoulder pain at worst for the past 7 days when doing her hair.   PAIN:  Are you having pain? See subjective   TODAY'S TREATMENT:  DATE: 03/18/2023  Therapeutic exercise   Seated shoulder ER AAROM to end range with PT assist to end range   R 10x5 seconds for 3 sets  L 10x5 seconds for 3 sets  Hooklying   B shoulder flexion to put both hands behind head (to simulate fixing her hair) 10x   B proximal lateral shoulder and arm pain, eases with rest.    B shoulder ER with folded pillow under each arm 10x3 each UE   Again: B shoulder flexion to put both hands behind head (to simulate fixing her hair) 5x2      Manually resisted triceps extension isometrics at 45 degrees shoulder flexion and slight elbow flexion   R 10x 5 seconds     L 10x 5 seconds     Improved exercise technique, movement at target joints, use of target muscles after mod verbal, visual, tactile cues.      Response to treatment  Pt tolerated session well without aggravation of symptoms.        Clinical impression Worked on improving ER ROM and strength as well as triceps strength to improve  glenohumeral mechanics when fixing her hair. Pt tolerated session well without aggravation of symptoms. Pt will benefit from continued skilled physical therapy services to decrease pain, improve AROM, strength and function.     PATIENT EDUCATION: Education details: there-ex, HEP Person educated: Patient Education method: Explanation, Demonstration, Tactile cues, Verbal cues, and Handouts Education comprehension: verbalized understanding and returned demonstration  HOME EXERCISE PROGRAM: Access Code: [email or web address] URL: [email or web address] Date: 02/05/2023 Prepared by: Loralyn Freshwater   Exercises - Seated Shoulder External Rotation AAROM with Dowel  - 2-3 x daily - 7 x weekly - 3 sets - 10 reps - 5 seconds hold  - Isometric Shoulder Flexion at Wall  - 1 x daily - 7 x weekly - 3 sets - 10 reps -  5 seconds hold - Isometric Shoulder Abduction - Arm Straight at Wall  - 1 x daily - 7 x weekly - 3 sets - 10 reps - 5 seconds hold  - Isometric Tricep Extension   - 1 x daily - 7 x weekly - 3 sets - 10 reps - 5 seconds hold  - Single Arm Shoulder Extension with Anchored Resistance  - 1 x daily - 7 x weekly - 3 sets - 10 reps  Red band - Supine Shoulder External Rotation with Resistance  - 1 x daily - 7 x weekly - 3 sets - 10 reps  Yellow band      PT Short Term Goals - 02/05/23 1531       PT SHORT TERM GOAL #1   Title Pt will be independent with her initial HEP to improve B shoulder AROM, strength, and decrease pain.    Baseline Pt has started her initial HEP (02/05/2023)    Time 3    Period Weeks    Status New    Target Date 02/27/23              PT Long Term Goals - 02/05/23 1533       PT LONG TERM GOAL #1   Title Pt will improve R and L shoulder flexion, abduction AROM by at least 20 degrees to promote ability to reach, as well as lift items with less difficulty.    Baseline Shoulder AROM: flexion 116 degrees R, 91 degrees L, abduction 82 degrees R, 71 degrees L  (02/05/2023)    Time 8    Period  Weeks    Status New    Target Date 04/03/23      PT LONG TERM GOAL #2   Title Pt will improve B shoulder ER strength by at least 1/2 MMT grade to promote ability to raise her arms up as well as lift with less pain and difficulty.    Baseline Shoulder ER 3+/5 R (at available range), 4/5 L (02/05/2023)    Time 8    Period Weeks    Status New    Target Date 04/03/23      PT LONG TERM GOAL #3   Title Pt will improve her shoulder FOTO score by at least 10 points as a demonstration of improved function.    Baseline Shoulder FOTO 58 (02/05/2023)    Time 8    Period Weeks    Status New    Target Date 04/03/23              Plan - 03/18/23 1350     Clinical Impression Statement Worked on improving ER ROM and strength as well as triceps strength to improve glenohumeral mechanics when fixing her hair. Pt tolerated session well without aggravation of symptoms. Pt will benefit from continued skilled physical therapy services to decrease pain, improve AROM, strength and function.    Personal Factors and Comorbidities Comorbidity 3+;Age;Fitness;Past/Current Experience;Time since onset of injury/illness/exacerbation    Comorbidities Asthma, depression, HTN    Examination-Activity Limitations Carry;Reach Overhead;Lift;Sleep;Bathing;Dressing    Stability/Clinical Decision Making Stable/Uncomplicated    Rehab Potential Fair    PT Frequency 2x / week    PT Duration 8 weeks    PT Treatment/Interventions Therapeutic exercise;Therapeutic activities;Neuromuscular re-education;Patient/family education;Manual techniques;Dry needling;Passive range of motion;Aquatic Therapy;Electrical Stimulation;Iontophoresis 4mg /ml Dexamethasone    PT Next Visit Plan AAROM, ER strengthening, strengthening throughout range, scapular strength, manual techniques, modalities PRN    PT Home Exercise Plan medbridge Access Code: [email or web address]    Consulted and Agree with Plan of Care Patient                Loralyn Freshwater PT, DPT  03/18/2023, 3:28 PM

## 2023-03-25 ENCOUNTER — Ambulatory Visit: Payer: Medicare HMO

## 2023-03-26 ENCOUNTER — Ambulatory Visit: Payer: Medicare HMO

## 2023-03-26 DIAGNOSIS — G8929 Other chronic pain: Secondary | ICD-10-CM

## 2023-03-26 DIAGNOSIS — M25511 Pain in right shoulder: Secondary | ICD-10-CM | POA: Diagnosis not present

## 2023-03-26 DIAGNOSIS — M25611 Stiffness of right shoulder, not elsewhere classified: Secondary | ICD-10-CM

## 2023-03-26 DIAGNOSIS — M25612 Stiffness of left shoulder, not elsewhere classified: Secondary | ICD-10-CM

## 2023-03-26 DIAGNOSIS — M6281 Muscle weakness (generalized): Secondary | ICD-10-CM

## 2023-03-26 NOTE — Therapy (Signed)
OUTPATIENT PHYSICAL THERAPY TREATMENT NOTE   Patient Name: Jodi Steele MRN: [id number] DOB:1951/08/11, 72 y.o., female Today's Date: 03/26/2023  PCP: Judy Pimple, MD  REFERRING PROVIDER: Judy Pimple, MD   END OF SESSION:  PT End of Session - 03/26/23 1305     Visit Number 7    Number of Visits 17    Date for PT Re-Evaluation 04/03/23    PT Start Time 1306    PT Stop Time 1347    PT Time Calculation (min) 41 min    Activity Tolerance Patient limited by pain    Behavior During Therapy WFL for tasks assessed/performed                  Past Medical History:  Diagnosis Date   Asthma    related to pet/dog exposure   Bunion    Colon polyp    Depression    Diffuse cystic mastopathy    Goiter, unspecified    surgery   HTN (hypertension)    Hypothyroidism    Pure hypercholesterolemia    Past Surgical History:  Procedure Laterality Date   BREAST BIOPSY Right 1985   abcsess   BREAST CYST ASPIRATION Left    negative   COLONOSCOPY  11/10   polyp   INCISION AND DRAINAGE BREAST ABSCESS     right   THYROIDECTOMY     TUBAL LIGATION     Patient Active Problem List   Diagnosis Date Noted   Encounter for screening mammogram for breast cancer 12/09/2022   Shoulder pain, bilateral 12/09/2022   Osteopenia 01/18/2018   Estrogen deficiency 09/14/2017   Colon cancer screening 06/27/2015   Encounter for routine gynecological examination 12/10/2012   Obesity 11/28/2011   Routine general medical examination at a health care facility 08/14/2011   Hypothyroidism 09/04/2008   Pure hypercholesterolemia 09/04/2008   HYPERTENSION, BENIGN ESSENTIAL 04/20/2007   DEPRESSION 04/07/2007   Asthma 04/07/2007   FIBROCYSTIC BREAST DISEASE 04/07/2007   BUNION 04/07/2007    REFERRING DIAG: Chronic pain of both shoulders   THERAPY DIAG:  Chronic right shoulder pain  Chronic left shoulder pain  Stiffness of left shoulder, not elsewhere classified  Muscle  weakness (generalized)  Stiffness of right shoulder joint  Rationale for Evaluation and Treatment Rehabilitation  PERTINENT HISTORY: Chronic pain of both shoulders. R hand dominant. 2014, pt fell and dislocated her R shoulder, had PT, fracture has healed. since then, pt has developed arthritis and has a bone spur in R shoulder. Pain has improved since working in her garden. Also has osteopenia.   PRECAUTIONS: Osteopenia    No latex allergies    SUBJECTIVE:   SUBJECTIVE STATEMENT: Shoulders are tired. No pain currently. Feels like she can continue her progess with her HEP after her last scheduled appointment.    PAIN:  Are you having pain? See subjective   TODAY'S TREATMENT:  DATE: 03/26/2023  Therapeutic exercise   Seated shoulder ER AAROM to end range with PT assist to end range   R 10x5 seconds for 3 sets  L 10x5 seconds for 3 sets  Shoulder AROM flexion and abduction 1x each way for each UE  Manually resisted shoulder ER 1x each UE   Hooklying   B shoulder flexion 10x3   B shoulder ER yellow band with folded pillow under each arm 10x2 each UE     Improved exercise technique, movement at target joints, use of target muscles after mod verbal, visual, tactile cues.     Manual therapy Supine with shoulder in flexion  STM to L teres major muscle to decrease tension   STM to R teres major muscle to decrease tension      Response to treatment  Pt tolerated session well without aggravation of symptoms.        Clinical impression Improved B shoulder flexion, abduction AROM as well as ER strength since initial evaluation. Continued working on improving ER ROM and strength to improve glenohumeral mechanics when raising her arms up. Worked on STM to decrease B teres major muscle tension to improve shoulder flexion AROM. Pt  tolerated session well without aggravation of symptoms. Pt will benefit from continued skilled physical therapy services to decrease pain, improve AROM, strength and function.     PATIENT EDUCATION: Education details: there-ex, HEP Person educated: Patient Education method: Explanation, Demonstration, Tactile cues, Verbal cues, and Handouts Education comprehension: verbalized understanding and returned demonstration  HOME EXERCISE PROGRAM: Access Code: [email or web address] URL: [email or web address] Date: 02/05/2023 Prepared by: Loralyn Freshwater   Exercises - Seated Shoulder External Rotation AAROM with Dowel  - 2-3 x daily - 7 x weekly - 3 sets - 10 reps - 5 seconds hold  - Isometric Shoulder Flexion at Wall  - 1 x daily - 7 x weekly - 3 sets - 10 reps -  5 seconds hold - Isometric Shoulder Abduction - Arm Straight at Wall  - 1 x daily - 7 x weekly - 3 sets - 10 reps - 5 seconds hold  - Isometric Tricep Extension   - 1 x daily - 7 x weekly - 3 sets - 10 reps - 5 seconds hold  - Single Arm Shoulder Extension with Anchored Resistance  - 1 x daily - 7 x weekly - 3 sets - 10 reps  Red band - Supine Shoulder External Rotation with Resistance  - 1 x daily - 7 x weekly - 3 sets - 10 reps  Yellow band  - Supine Shoulder Flexion Extension Full Range AROM  - 1 x daily - 7 x weekly - 3 sets - 10 reps   PT Short Term Goals - 02/05/23 1531       PT SHORT TERM GOAL #1   Title Pt will be independent with her initial HEP to improve B shoulder AROM, strength, and decrease pain.    Baseline Pt has started her initial HEP (02/05/2023)    Time 3    Period Weeks    Status New    Target Date 02/27/23              PT Long Term Goals - 03/26/23 1318       PT LONG TERM GOAL #1   Title Pt will improve R and L shoulder flexion, abduction AROM by at least 20 degrees to promote ability to reach, as well as lift items with less difficulty.    Baseline  Shoulder AROM: flexion 116 degrees R, 91  degrees L, abduction 82 degrees R, 71 degrees L (02/05/2023); flexion 132 degrees R, 97 degrees L, abduction 90 degrees R, 81 degrees L (03/26/2023)    Time 8    Period Weeks    Status Partially Met    Target Date 04/03/23      PT LONG TERM GOAL #2   Title Pt will improve B shoulder ER strength by at least 1/2 MMT grade to promote ability to raise her arms up as well as lift with less pain and difficulty.    Baseline Shoulder ER 3+/5 R (at available range), 4/5 L (02/05/2023); at available range: 4/5 R, 4+/5 L (03/26/2023)    Time 8    Period Weeks    Status Achieved    Target Date 04/03/23      PT LONG TERM GOAL #3   Title Pt will improve her shoulder FOTO score by at least 10 points as a demonstration of improved function.    Baseline Shoulder FOTO 58 (02/05/2023)    Time 8    Period Weeks    Status New    Target Date 04/03/23              Plan - 03/26/23 1305     Clinical Impression Statement Improved B shoulder flexion, abduction AROM as well as ER strength since initial evaluation. Continued working on improving ER ROM and strength to improve glenohumeral mechanics when raising her arms up. Worked on STM to decrease B teres major muscle tension to improve shoulder flexion AROM. Pt tolerated session well without aggravation of symptoms. Pt will benefit from continued skilled physical therapy services to decrease pain, improve AROM, strength and function.    Personal Factors and Comorbidities Comorbidity 3+;Age;Fitness;Past/Current Experience;Time since onset of injury/illness/exacerbation    Comorbidities Asthma, depression, HTN    Examination-Activity Limitations Carry;Reach Overhead;Lift;Sleep;Bathing;Dressing    Stability/Clinical Decision Making Stable/Uncomplicated    Rehab Potential Fair    PT Frequency 2x / week    PT Duration 8 weeks    PT Treatment/Interventions Therapeutic exercise;Therapeutic activities;Neuromuscular re-education;Patient/family education;Manual  techniques;Dry needling;Passive range of motion;Aquatic Therapy;Electrical Stimulation;Iontophoresis 4mg /ml Dexamethasone    PT Next Visit Plan AAROM, ER strengthening, strengthening throughout range, scapular strength, manual techniques, modalities PRN    PT Home Exercise Plan medbridge Access Code: [email or web address]    Consulted and Agree with Plan of Care Patient                Loralyn Freshwater PT, DPT  03/26/2023, 1:56 PM

## 2023-04-01 ENCOUNTER — Ambulatory Visit: Payer: Medicare HMO

## 2023-04-01 DIAGNOSIS — G8929 Other chronic pain: Secondary | ICD-10-CM

## 2023-04-01 DIAGNOSIS — M6281 Muscle weakness (generalized): Secondary | ICD-10-CM

## 2023-04-01 DIAGNOSIS — M25612 Stiffness of left shoulder, not elsewhere classified: Secondary | ICD-10-CM

## 2023-04-01 DIAGNOSIS — M25611 Stiffness of right shoulder, not elsewhere classified: Secondary | ICD-10-CM

## 2023-04-01 DIAGNOSIS — M25511 Pain in right shoulder: Secondary | ICD-10-CM | POA: Diagnosis not present

## 2023-04-01 NOTE — Therapy (Signed)
OUTPATIENT PHYSICAL THERAPY TREATMENT NOTE   Patient Name: Jodi Steele MRN: [id number] DOB:1950/11/07, 72 y.o., female Today's Date: 04/01/2023  PCP: Judy Pimple, MD  REFERRING PROVIDER: Judy Pimple, MD   END OF SESSION:  PT End of Session - 04/01/23 1301     Visit Number 8    Number of Visits 17    Date for PT Re-Evaluation 04/03/23    PT Start Time 1301    PT Stop Time 1346    PT Time Calculation (min) 45 min    Activity Tolerance Patient limited by pain    Behavior During Therapy WFL for tasks assessed/performed                   Past Medical History:  Diagnosis Date   Asthma    related to pet/dog exposure   Bunion    Colon polyp    Depression    Diffuse cystic mastopathy    Goiter, unspecified    surgery   HTN (hypertension)    Hypothyroidism    Pure hypercholesterolemia    Past Surgical History:  Procedure Laterality Date   BREAST BIOPSY Right 1985   abcsess   BREAST CYST ASPIRATION Left    negative   COLONOSCOPY  11/10   polyp   INCISION AND DRAINAGE BREAST ABSCESS     right   THYROIDECTOMY     TUBAL LIGATION     Patient Active Problem List   Diagnosis Date Noted   Encounter for screening mammogram for breast cancer 12/09/2022   Shoulder pain, bilateral 12/09/2022   Osteopenia 01/18/2018   Estrogen deficiency 09/14/2017   Colon cancer screening 06/27/2015   Encounter for routine gynecological examination 12/10/2012   Obesity 11/28/2011   Routine general medical examination at a health care facility 08/14/2011   Hypothyroidism 09/04/2008   Pure hypercholesterolemia 09/04/2008   HYPERTENSION, BENIGN ESSENTIAL 04/20/2007   DEPRESSION 04/07/2007   Asthma 04/07/2007   FIBROCYSTIC BREAST DISEASE 04/07/2007   BUNION 04/07/2007    REFERRING DIAG: Chronic pain of both shoulders   THERAPY DIAG:  Chronic right shoulder pain  Chronic left shoulder pain  Stiffness of left shoulder, not elsewhere classified  Muscle  weakness (generalized)  Stiffness of right shoulder joint  Rationale for Evaluation and Treatment Rehabilitation  PERTINENT HISTORY: Chronic pain of both shoulders. R hand dominant. 2014, pt fell and dislocated her R shoulder, had PT, fracture has healed. since then, pt has developed arthritis and has a bone spur in R shoulder. Pain has improved since working in her garden. Also has osteopenia.   PRECAUTIONS: Osteopenia    No latex allergies    SUBJECTIVE:   SUBJECTIVE STATEMENT: Shoulders are tired and sore. No pain currently. Better able to reach and scrub.     PAIN:  Are you having pain? See subjective   TODAY'S TREATMENT:  DATE: 04/01/2023   Manual therapy Supine with shoulder in flexion  STM to L teres major muscle to decrease tension   STM to R teres major muscle to decrease tension   Therapeutic exercise  Hooklying   B shoulder flexion 10x3   B shoulder ER yellow band with folded pillow under each arm 10x2 each UE  Supine shoulder ER A/AROM to end range   R 10x5 seconds for 3 sets with PT assist to end range   L 10x5 seconds for 3 sets  Standing scapular retraction and shoulder extension, yellow band  R  10x2 with palm forward     Improved exercise technique, movement at target joints, use of target muscles after mod verbal, visual, tactile cues.       Response to treatment  Pt tolerated session well without aggravation of symptoms.        Clinical impression  Continued working on improving ER ROM and strength to improve glenohumeral mechanics when raising her arms up. Continued working on STM to decrease B teres major muscle tension to improve shoulder flexion AROM. Pt tolerated session well without aggravation of symptoms. Pt will benefit from continued skilled physical therapy services to decrease pain, improve  AROM, strength and function.     PATIENT EDUCATION: Education details: there-ex, HEP Person educated: Patient Education method: Explanation, Demonstration, Tactile cues, Verbal cues, and Handouts Education comprehension: verbalized understanding and returned demonstration  HOME EXERCISE PROGRAM: Access Code: [email or web address] URL: [email or web address] Date: 02/05/2023 Prepared by: Loralyn Freshwater   Exercises - Seated Shoulder External Rotation AAROM with Dowel  - 2-3 x daily - 7 x weekly - 3 sets - 10 reps - 5 seconds hold  - Isometric Shoulder Flexion at Wall  - 1 x daily - 7 x weekly - 3 sets - 10 reps -  5 seconds hold - Isometric Shoulder Abduction - Arm Straight at Wall  - 1 x daily - 7 x weekly - 3 sets - 10 reps - 5 seconds hold  - Isometric Tricep Extension   - 1 x daily - 7 x weekly - 3 sets - 10 reps - 5 seconds hold  - Single Arm Shoulder Extension with Anchored Resistance  - 1 x daily - 7 x weekly - 3 sets - 10 reps  Red band - Supine Shoulder External Rotation with Resistance  - 1 x daily - 7 x weekly - 3 sets - 10 reps  Yellow band  - Supine Shoulder Flexion Extension Full Range AROM  - 1 x daily - 7 x weekly - 3 sets - 10 reps       PT Short Term Goals - 02/05/23 1531       PT SHORT TERM GOAL #1   Title Pt will be independent with her initial HEP to improve B shoulder AROM, strength, and decrease pain.    Baseline Pt has started her initial HEP (02/05/2023)    Time 3    Period Weeks    Status New    Target Date 02/27/23              PT Long Term Goals - 04/01/23 1352       PT LONG TERM GOAL #1   Title Pt will improve R and L shoulder flexion, abduction AROM by at least 20 degrees to promote ability to reach, as well as lift items with less difficulty.    Baseline Shoulder AROM: flexion 116 degrees R, 91 degrees L, abduction 82 degrees R,  71 degrees L (02/05/2023); flexion 132 degrees R, 97 degrees L, abduction 90 degrees R, 81 degrees L  (03/26/2023)    Time 8    Period Weeks    Status Partially Met    Target Date 04/03/23      PT LONG TERM GOAL #2   Title Pt will improve B shoulder ER strength by at least 1/2 MMT grade to promote ability to raise her arms up as well as lift with less pain and difficulty.    Baseline Shoulder ER 3+/5 R (at available range), 4/5 L (02/05/2023); at available range: 4/5 R, 4+/5 L (03/26/2023)    Time 8    Period Weeks    Status Achieved    Target Date 04/03/23      PT LONG TERM GOAL #3   Title Pt will improve her shoulder FOTO score by at least 10 points as a demonstration of improved function.    Baseline Shoulder FOTO 58 (02/05/2023); 59 (04/01/2023)    Time 8    Period Weeks    Status On-going    Target Date 04/03/23              Plan - 04/01/23 1301     Clinical Impression Statement Continued working on improving ER ROM and strength to improve glenohumeral mechanics when raising her arms up. Continued working on STM to decrease B teres major muscle tension to improve shoulder flexion AROM. Pt tolerated session well without aggravation of symptoms. Pt will benefit from continued skilled physical therapy services to decrease pain, improve AROM, strength and function.    Personal Factors and Comorbidities Comorbidity 3+;Age;Fitness;Past/Current Experience;Time since onset of injury/illness/exacerbation    Comorbidities Asthma, depression, HTN    Examination-Activity Limitations Carry;Reach Overhead;Lift;Sleep;Bathing;Dressing    Stability/Clinical Decision Making Stable/Uncomplicated    Rehab Potential Fair    PT Frequency 2x / week    PT Duration 8 weeks    PT Treatment/Interventions Therapeutic exercise;Therapeutic activities;Neuromuscular re-education;Patient/family education;Manual techniques;Dry needling;Passive range of motion;Aquatic Therapy;Electrical Stimulation;Iontophoresis 4mg /ml Dexamethasone    PT Next Visit Plan AAROM, ER strengthening, strengthening throughout range,  scapular strength, manual techniques, modalities PRN    PT Home Exercise Plan medbridge Access Code: [email or web address]    Consulted and Agree with Plan of Care Patient                Loralyn Freshwater PT, DPT  04/01/2023, 1:55 PM

## 2023-04-08 ENCOUNTER — Ambulatory Visit: Payer: Medicare HMO

## 2023-05-13 ENCOUNTER — Encounter (INDEPENDENT_AMBULATORY_CARE_PROVIDER_SITE_OTHER): Payer: Self-pay

## 2023-05-18 ENCOUNTER — Other Ambulatory Visit: Payer: Self-pay | Admitting: Oncology

## 2023-05-18 DIAGNOSIS — Z006 Encounter for examination for normal comparison and control in clinical research program: Secondary | ICD-10-CM

## 2023-06-01 ENCOUNTER — Ambulatory Visit (INDEPENDENT_AMBULATORY_CARE_PROVIDER_SITE_OTHER): Payer: Medicare HMO

## 2023-06-01 DIAGNOSIS — Z0000 Encounter for general adult medical examination without abnormal findings: Secondary | ICD-10-CM

## 2023-06-01 NOTE — Patient Instructions (Signed)
Jodi Steele , Thank you for taking time to come for your Medicare Wellness Visit. I appreciate your ongoing commitment to your health goals. Please review the following plan we discussed and let me know if I can assist you in the future.   Referrals/Orders/Follow-Ups/Clinician Recommendations: none  This is a list of the screening recommended for you and due dates:  Health Maintenance  Topic Date Due   Hepatitis C Screening  Never done   Zoster (Shingles) Vaccine (1 of 2) 01/04/2002   COVID-19 Vaccine (4 - 2023-24 season) 06/13/2022   Flu Shot  05/14/2023   Mammogram  01/28/2024   Medicare Annual Wellness Visit  05/31/2024   Colon Cancer Screening  11/15/2030   DTaP/Tdap/Td vaccine (4 - Td or Tdap) 12/03/2031   Pneumonia Vaccine  Completed   DEXA scan (bone density measurement)  Completed   HPV Vaccine  Aged Out    Advanced directives: (Copy Requested) Please bring a copy of your health care power of attorney and living will to the office to be added to your chart at your convenience.  Next Medicare Annual Wellness Visit scheduled for next year: Yes  Preventive Care 90 Years and Older, Female Preventive care refers to lifestyle choices and visits with your health care provider that can promote health and wellness. What does preventive care include? A yearly physical exam. This is also called an annual well check. Dental exams once or twice a year. Routine eye exams. Ask your health care provider how often you should have your eyes checked. Personal lifestyle choices, including: Daily care of your teeth and gums. Regular physical activity. Eating a healthy diet. Avoiding tobacco and drug use. Limiting alcohol use. Practicing safe sex. Taking low-dose aspirin every day. Taking vitamin and mineral supplements as recommended by your health care provider. What happens during an annual well check? The services and screenings done by your health care provider during your annual well  check will depend on your age, overall health, lifestyle risk factors, and family history of disease. Counseling  Your health care provider may ask you questions about your: Alcohol use. Tobacco use. Drug use. Emotional well-being. Home and relationship well-being. Sexual activity. Eating habits. History of falls. Memory and ability to understand (cognition). Work and work Astronomer. Reproductive health. Screening  You may have the following tests or measurements: Height, weight, and BMI. Blood pressure. Lipid and cholesterol levels. These may be checked every 5 years, or more frequently if you are over 46 years old. Skin check. Lung cancer screening. You may have this screening every year starting at age 70 if you have a 30-pack-year history of smoking and currently smoke or have quit within the past 15 years. Fecal occult blood test (FOBT) of the stool. You may have this test every year starting at age 66. Flexible sigmoidoscopy or colonoscopy. You may have a sigmoidoscopy every 5 years or a colonoscopy every 10 years starting at age 45. Hepatitis C blood test. Hepatitis B blood test. Sexually transmitted disease (STD) testing. Diabetes screening. This is done by checking your blood sugar (glucose) after you have not eaten for a while (fasting). You may have this done every 1-3 years. Bone density scan. This is done to screen for osteoporosis. You may have this done starting at age 58. Mammogram. This may be done every 1-2 years. Talk to your health care provider about how often you should have regular mammograms. Talk with your health care provider about your test results, treatment options, and if  necessary, the need for more tests. Vaccines  Your health care provider may recommend certain vaccines, such as: Influenza vaccine. This is recommended every year. Tetanus, diphtheria, and acellular pertussis (Tdap, Td) vaccine. You may need a Td booster every 10 years. Zoster  vaccine. You may need this after age 16. Pneumococcal 13-valent conjugate (PCV13) vaccine. One dose is recommended after age 52. Pneumococcal polysaccharide (PPSV23) vaccine. One dose is recommended after age 62. Talk to your health care provider about which screenings and vaccines you need and how often you need them. This information is not intended to replace advice given to you by your health care provider. Make sure you discuss any questions you have with your health care provider. Document Released: 10/26/2015 Document Revised: 06/18/2016 Document Reviewed: 07/31/2015 Elsevier Interactive Patient Education  2017 [employer].  Fall Prevention in the Home Falls can cause injuries. They can happen to people of all ages. There are many things you can do to make your home safe and to help prevent falls. What can I do on the outside of my home? Regularly fix the edges of walkways and driveways and fix any cracks. Remove anything that might make you trip as you walk through a door, such as a raised step or threshold. Trim any bushes or trees on the path to your home. Use bright outdoor lighting. Clear any walking paths of anything that might make someone trip, such as rocks or tools. Regularly check to see if handrails are loose or broken. Make sure that both sides of any steps have handrails. Any raised decks and porches should have guardrails on the edges. Have any leaves, snow, or ice cleared regularly. Use sand or salt on walking paths during winter. Clean up any spills in your garage right away. This includes oil or grease spills. What can I do in the bathroom? Use night lights. Install grab bars by the toilet and in the tub and shower. Do not use towel bars as grab bars. Use non-skid mats or decals in the tub or shower. If you need to sit down in the shower, use a plastic, non-slip stool. Keep the floor dry. Clean up any water that spills on the floor as soon as it happens. Remove  soap buildup in the tub or shower regularly. Attach bath mats securely with double-sided non-slip rug tape. Do not have throw rugs and other things on the floor that can make you trip. What can I do in the bedroom? Use night lights. Make sure that you have a light by your bed that is easy to reach. Do not use any sheets or blankets that are too big for your bed. They should not hang down onto the floor. Have a firm chair that has side arms. You can use this for support while you get dressed. Do not have throw rugs and other things on the floor that can make you trip. What can I do in the kitchen? Clean up any spills right away. Avoid walking on wet floors. Keep items that you use a lot in easy-to-reach places. If you need to reach something above you, use a strong step stool that has a grab bar. Keep electrical cords out of the way. Do not use floor polish or wax that makes floors slippery. If you must use wax, use non-skid floor wax. Do not have throw rugs and other things on the floor that can make you trip. What can I do with my stairs? Do not leave any items  on the stairs. Make sure that there are handrails on both sides of the stairs and use them. Fix handrails that are broken or loose. Make sure that handrails are as long as the stairways. Check any carpeting to make sure that it is firmly attached to the stairs. Fix any carpet that is loose or worn. Avoid having throw rugs at the top or bottom of the stairs. If you do have throw rugs, attach them to the floor with carpet tape. Make sure that you have a light switch at the top of the stairs and the bottom of the stairs. If you do not have them, ask someone to add them for you. What else can I do to help prevent falls? Wear shoes that: Do not have high heels. Have rubber bottoms. Are comfortable and fit you well. Are closed at the toe. Do not wear sandals. If you use a stepladder: Make sure that it is fully opened. Do not climb a  closed stepladder. Make sure that both sides of the stepladder are locked into place. Ask someone to hold it for you, if possible. Clearly mark and make sure that you can see: Any grab bars or handrails. First and last steps. Where the edge of each step is. Use tools that help you move around (mobility aids) if they are needed. These include: Canes. Walkers. Scooters. Crutches. Turn on the lights when you go into a dark area. Replace any light bulbs as soon as they burn out. Set up your furniture so you have a clear path. Avoid moving your furniture around. If any of your floors are uneven, fix them. If there are any pets around you, be aware of where they are. Review your medicines with your doctor. Some medicines can make you feel dizzy. This can increase your chance of falling. Ask your doctor what other things that you can do to help prevent falls. This information is not intended to replace advice given to you by your health care provider. Make sure you discuss any questions you have with your health care provider. Document Released: 07/26/2009 Document Revised: 03/06/2016 Document Reviewed: 11/03/2014 Elsevier Interactive Patient Education  2017 [employer].

## 2023-06-01 NOTE — Progress Notes (Signed)
Subjective:   Jodi Steele is a 72 y.o. female who presents for an Initial Medicare Annual Wellness Visit.  Visit Complete: Virtual  I connected with  Jodi Steele on 06/01/23 by a audio enabled telemedicine application and verified that I am speaking with the correct person using two identifiers.  Patient Location: Home  Provider Location: Office/Clinic  I discussed the limitations of evaluation and management by telemedicine. The patient expressed understanding and agreed to proceed. Vital Signs: Unable to obtain new vitals due to this being a telehealth visit.  Review of Systems     Cardiac Risk Factors include: advanced age (>41men, >67 women);hypertension     Objective:    Today's Vitals   There is no height or weight on file to calculate BMI.     06/01/2023    8:50 AM  Advanced Directives  Does Patient Have a Medical Advance Directive? Yes  Type of Estate agent of [place];Living will  Copy of Healthcare Power of Attorney in Chart? No - copy requested    Current Medications (verified) Outpatient Encounter Medications as of 06/01/2023  Medication Sig   albuterol (VENTOLIN HFA) 108 (90 Base) MCG/ACT inhaler INHALE 2 PUFFS BY MOUTH EVERY 4 HOURS AS NEEDED   Ascorbic Acid (VITAMIN C) 1000 MG tablet Take 1,000 mg by mouth daily.   budesonide-formoterol (SYMBICORT) 80-4.5 MCG/ACT inhaler Inhale 2 puffs into the lungs 2 (two) times daily.   Calcium Carbonate (CALCIUM 600 PO) Take 1,200 mg by mouth daily.   levothyroxine (SYNTHROID) 112 MCG tablet Take 1 tablet (112 mcg total) by mouth daily.   lisinopril (ZESTRIL) 5 MG tablet TAKE 1 TABLET(5 MG) BY MOUTH DAILY   No facility-administered encounter medications on file as of 06/01/2023.    Allergies (verified) Patient has no known allergies.   History: Past Medical History:  Diagnosis Date   Asthma    related to pet/dog exposure   Bunion    Colon polyp    Depression    Diffuse  cystic mastopathy    Goiter, unspecified    surgery   HTN (hypertension)    Hypothyroidism    Pure hypercholesterolemia    Past Surgical History:  Procedure Laterality Date   BREAST BIOPSY Right 1985   abcsess   BREAST CYST ASPIRATION Left    negative   COLONOSCOPY  11/10   polyp   INCISION AND DRAINAGE BREAST ABSCESS     right   THYROIDECTOMY     TUBAL LIGATION     Family History  Problem Relation Age of Onset   Other Father        Mesothelioma   Thyroid cancer Mother        Anaplastic thyroid cancer   Hypertension Mother    Diabetes Mother    Thyroid cancer Sister    Diabetes Other        GF   Breast cancer Neg Hx    Social History   Socioeconomic History   Marital status: Widowed    Spouse name: Not on file   Number of children: 2   Years of education: Not on file   Highest education level: Not on file  Occupational History   Occupation: Probation officer artisit  Tobacco Use   Smoking status: Former    Current packs/day: 0.00    Types: Cigarettes    Quit date: 10/13/1988    Years since quitting: 34.6    Passive exposure: Past   Smokeless tobacco: Never  Vaping Use   Vaping status: Never Used  Substance and Sexual Activity   Alcohol use: Yes    Alcohol/week: 0.0 standard drinks of alcohol    Comment: daily usually wine   Drug use: No   Sexual activity: Not on file  Other Topics Concern   Not on file  Social History Narrative   Married; 2 sons      Sports coach         Social Determinants of Health   Financial Resource Strain: Low Risk  (06/01/2023)   Overall Financial Resource Strain (CARDIA)    Difficulty of Paying Living Expenses: Not hard at all  Food Insecurity: No Food Insecurity (06/01/2023)   Hunger Vital Sign    Worried About Running Out of Food in the Last Year: Never true    Ran Out of Food in the Last Year: Never true  Transportation Needs: No Transportation Needs (06/01/2023)   PRAPARE - Therapist, art (Medical): No    Lack of Transportation (Non-Medical): No  Physical Activity: Sufficiently Active (06/01/2023)   Exercise Vital Sign    Days of Exercise per Week: 5 days    Minutes of Exercise per Session: 30 min  Stress: No Stress Concern Present (06/01/2023)   [employer] of Occupational Health - Occupational Stress Questionnaire    Feeling of Stress : Not at all  Social Connections: Moderately Isolated (06/01/2023)   Social Connection and Isolation Panel [NHANES]    Frequency of Communication with Friends and Family: Twice a week    Frequency of Social Gatherings with Friends and Family: Twice a week    Attends Religious Services: Never    Database administrator or Organizations: Yes    Attends Engineer, structural: More than 4 times per year    Marital Status: Widowed    Tobacco Counseling Counseling given: Not Answered   Clinical Intake:  Pre-visit preparation completed: Yes  Pain : No/denies pain     Nutritional Risks: None Diabetes: No  How often do you need to have someone help you when you read instructions, pamphlets, or other written materials from your doctor or pharmacy?: 1 - Never  Interpreter Needed?: No  Information entered by :: NAllen LPN   Activities of Daily Living    06/01/2023    8:42 AM  In your present state of health, do you have any difficulty performing the following activities:  Hearing? 0  Vision? 0  Difficulty concentrating or making decisions? 0  Walking or climbing stairs? 0  Dressing or bathing? 0  Doing errands, shopping? 0  Preparing Food and eating ? N  Using the Toilet? N  In the past six months, have you accidently leaked urine? N  Comment with sneeze or cough  Do you have problems with loss of bowel control? N  Managing your Medications? N  Managing your Finances? N  Housekeeping or managing your Housekeeping? N    Patient Care Team: Tower, Audrie Gallus, MD as PCP - General  Indicate  any recent Medical Services you may have received from other than [facility] providers in the past year (date may be approximate).     Assessment:   This is a routine wellness examination for [place].  Hearing/Vision screen Hearing Screening - Comments:: Denies hearing issues Vision Screening - Comments:: Regular eye exams,  Dietary issues and exercise activities discussed:     Goals Addressed  This Visit's Progress    Patient Stated       06/01/2023, wants to lose 20 pounds       Depression Screen    06/01/2023    8:53 AM 12/09/2022    2:03 PM 12/02/2021   11:08 AM 11/29/2020   10:44 AM 11/10/2019    8:54 AM 09/15/2018   12:36 PM 09/14/2017   12:23 PM  PHQ 2/9 Scores  PHQ - 2 Score 0 0 0 2 1 1  0  PHQ- 9 Score 0 2  7 5 5      Fall Risk    06/01/2023    8:51 AM 12/09/2022    2:03 PM 09/14/2017   12:23 PM  Fall Risk   Falls in the past year? 0 0 Yes  Number falls in past yr: 0 0 1  Injury with Fall? 0 0 No  Risk for fall due to : Medication side effect No Fall Risks   Follow up Falls prevention discussed;Falls evaluation completed Falls evaluation completed Falls evaluation completed    MEDICARE RISK AT HOME: Medicare Risk at Home Any stairs in or around the home?: Yes If so, are there any without handrails?: No Adequate lighting in your home to reduce risk of falls?: Yes Life alert?: No Use of a cane, walker or w/c?: No Grab bars in the bathroom?: Yes Shower chair or bench in shower?: Yes Elevated toilet seat or a handicapped toilet?: Yes  TIMED UP AND GO:  Was the test performed? No    Cognitive Function:        06/01/2023    8:54 AM  6CIT Screen  What Year? 0 points  What month? 0 points  What time? 0 points  Count back from 20 0 points  Months in reverse 0 points  Repeat phrase 0 points  Total Score 0 points    Immunizations Immunization History  Administered Date(s) Administered   Influenza Split 07/31/2011   Influenza Whole  07/14/2007   Influenza, High Dose Seasonal PF 07/19/2019   Influenza,inj,Quad PF,6+ Mos 09/14/2017   Influenza-Unspecified 07/26/2015, 07/29/2018, 07/12/2020   Moderna Sars-Covid-2 Vaccination 12/05/2019, 01/03/2020, 10/18/2020   Pneumococcal Conjugate-13 09/14/2017   Pneumococcal Polysaccharide-23 09/15/2018   Td 02/17/2005   Tdap 07/28/2012, 12/02/2021   Zoster, Live 12/25/2013    TDAP status: Up to date  Flu Vaccine status: Due, Education has been provided regarding the importance of this vaccine. Advised may receive this vaccine at local pharmacy or Health Dept. Aware to provide a copy of the vaccination record if obtained from local pharmacy or Health Dept. Verbalized acceptance and understanding.  Pneumococcal vaccine status: Up to date  Covid-19 vaccine status: Information provided on how to obtain vaccines.   Qualifies for Shingles Vaccine? Yes   Zostavax completed Yes   Shingrix Completed?: No.    Education has been provided regarding the importance of this vaccine. Patient has been advised to call insurance company to determine out of pocket expense if they have not yet received this vaccine. Advised may also receive vaccine at local pharmacy or Health Dept. Verbalized acceptance and understanding.  Screening Tests Health Maintenance  Topic Date Due   Hepatitis C Screening  Never done   Zoster Vaccines- Shingrix (1 of 2) 01/04/2002   COVID-19 Vaccine (4 - 2023-24 season) 06/13/2022   INFLUENZA VACCINE  05/14/2023   MAMMOGRAM  01/28/2024   Medicare Annual Wellness (AWV)  05/31/2024   Colonoscopy  11/15/2030   DTaP/Tdap/Td (4 - Td or Tdap) 12/03/2031  Pneumonia Vaccine 84+ Years old  Completed   DEXA SCAN  Completed   HPV VACCINES  Aged Out    Health Maintenance  Health Maintenance Due  Topic Date Due   Hepatitis C Screening  Never done   Zoster Vaccines- Shingrix (1 of 2) 01/04/2002   COVID-19 Vaccine (4 - 2023-24 season) 06/13/2022   INFLUENZA VACCINE   05/14/2023    Colorectal cancer screening: Type of screening: Colonoscopy. Completed 11/15/2020. Repeat every 10 years  Mammogram status: Completed 01/28/2023. Repeat every year  Bone Density status: Completed 01/28/2023.   Lung Cancer Screening: (Low Dose CT Chest recommended if Age 82-80 years, 20 pack-year currently smoking OR have quit w/in 15years.) does not qualify.   Lung Cancer Screening Referral: no  Additional Screening:  Hepatitis C Screening: does qualify;   Vision Screening: Recommended annual ophthalmology exams for early detection of glaucoma and other disorders of the eye. Is the patient up to date with their annual eye exam?  Yes  Who is the provider or what is the name of the office in which the patient attends annual eye exams? Can't remember name If pt is not established with a provider, would they like to be referred to a provider to establish care? No .   Dental Screening: Recommended annual dental exams for proper oral hygiene  Diabetic Foot Exam: n/a  Community Resource Referral / Chronic Care Management: CRR required this visit?  No   CCM required this visit?  No     Plan:     I have personally reviewed and noted the following in the patient's chart:   Medical and social history Use of alcohol, tobacco or illicit drugs  Current medications and supplements including opioid prescriptions. Patient is not currently taking opioid prescriptions. Functional ability and status Nutritional status Physical activity Advanced directives List of other physicians Hospitalizations, surgeries, and ER visits in previous 12 months Vitals Screenings to include cognitive, depression, and falls Referrals and appointments  In addition, I have reviewed and discussed with patient certain preventive protocols, quality metrics, and best practice recommendations. A written personalized care plan for preventive services as well as general preventive health recommendations  were provided to patient.     Barb Merino, LPN   [id number]   After Visit Summary: (MyChart) Due to this being a telephonic visit, the after visit summary with patients personalized plan was offered to patient via MyChart   Nurse Notes: none

## 2023-11-22 ENCOUNTER — Telehealth: Payer: Self-pay | Admitting: Family Medicine

## 2023-11-22 DIAGNOSIS — E7800 Pure hypercholesterolemia, unspecified: Secondary | ICD-10-CM

## 2023-11-22 DIAGNOSIS — I10 Essential (primary) hypertension: Secondary | ICD-10-CM

## 2023-11-22 DIAGNOSIS — Z131 Encounter for screening for diabetes mellitus: Secondary | ICD-10-CM | POA: Insufficient documentation

## 2023-11-22 DIAGNOSIS — E890 Postprocedural hypothyroidism: Secondary | ICD-10-CM

## 2023-11-22 NOTE — Telephone Encounter (Signed)
-----   Message from Alvina Chou sent at 11/18/2023  2:30 PM EST ----- Regarding: Lab orders for Mon, 2.10.25 Patient is scheduled for CPX labs, please order future labs, Thanks , Camelia Eng

## 2023-12-07 ENCOUNTER — Other Ambulatory Visit (INDEPENDENT_AMBULATORY_CARE_PROVIDER_SITE_OTHER): Payer: Medicare HMO

## 2023-12-07 DIAGNOSIS — E7800 Pure hypercholesterolemia, unspecified: Secondary | ICD-10-CM

## 2023-12-07 DIAGNOSIS — E890 Postprocedural hypothyroidism: Secondary | ICD-10-CM | POA: Diagnosis not present

## 2023-12-07 DIAGNOSIS — Z131 Encounter for screening for diabetes mellitus: Secondary | ICD-10-CM

## 2023-12-07 DIAGNOSIS — I10 Essential (primary) hypertension: Secondary | ICD-10-CM

## 2023-12-07 LAB — CBC WITH DIFFERENTIAL/PLATELET
Basophils Absolute: 0 10*3/uL (ref 0.0–0.1)
Basophils Relative: 0.4 % (ref 0.0–3.0)
Eosinophils Absolute: 0.3 10*3/uL (ref 0.0–0.7)
Eosinophils Relative: 4 % (ref 0.0–5.0)
HCT: 39.9 % (ref 36.0–46.0)
Hemoglobin: 13.2 g/dL (ref 12.0–15.0)
Lymphocytes Relative: 32.2 % (ref 12.0–46.0)
Lymphs Abs: 2.3 10*3/uL (ref 0.7–4.0)
MCHC: 33.1 g/dL (ref 30.0–36.0)
MCV: 88.3 fL (ref 78.0–100.0)
Monocytes Absolute: 0.5 10*3/uL (ref 0.1–1.0)
Monocytes Relative: 7.6 % (ref 3.0–12.0)
Neutro Abs: 4 10*3/uL (ref 1.4–7.7)
Neutrophils Relative %: 55.8 % (ref 43.0–77.0)
Platelets: 243 10*3/uL (ref 150.0–400.0)
RBC: 4.52 Mil/uL (ref 3.87–5.11)
RDW: 13.9 % (ref 11.5–15.5)
WBC: 7.1 10*3/uL (ref 4.0–10.5)

## 2023-12-07 LAB — COMPREHENSIVE METABOLIC PANEL
ALT: 15 U/L (ref 0–35)
AST: 18 U/L (ref 0–37)
Albumin: 4.3 g/dL (ref 3.5–5.2)
Alkaline Phosphatase: 72 U/L (ref 39–117)
BUN: 17 mg/dL (ref 6–23)
CO2: 30 meq/L (ref 19–32)
Calcium: 9.4 mg/dL (ref 8.4–10.5)
Chloride: 103 meq/L (ref 96–112)
Creatinine, Ser: 0.78 mg/dL (ref 0.40–1.20)
GFR: 76.15 mL/min (ref >60.00)
Glucose, Bld: 102 mg/dL — ABNORMAL HIGH (ref 70–99)
Potassium: 4.3 meq/L (ref 3.5–5.1)
Sodium: 141 meq/L (ref 135–145)
Total Bilirubin: 0.6 mg/dL (ref 0.2–1.2)
Total Protein: 6.8 g/dL (ref 6.0–8.3)

## 2023-12-07 LAB — LIPID PANEL
Cholesterol: 222 mg/dL — ABNORMAL HIGH (ref 0–200)
HDL: 60.3 mg/dL (ref >39.00)
LDL Cholesterol: 143 mg/dL — ABNORMAL HIGH (ref 0–99)
NonHDL: 161.75
Total CHOL/HDL Ratio: 4
Triglycerides: 93 mg/dL (ref 0.0–149.0)
VLDL: 18.6 mg/dL (ref 0.0–40.0)

## 2023-12-07 LAB — HEMOGLOBIN A1C: Hgb A1c MFr Bld: 5.8 % (ref 4.6–6.5)

## 2023-12-07 LAB — TSH: TSH: 1.78 u[IU]/mL (ref 0.35–5.50)

## 2023-12-14 ENCOUNTER — Ambulatory Visit (INDEPENDENT_AMBULATORY_CARE_PROVIDER_SITE_OTHER): Payer: Medicare HMO | Admitting: Family Medicine

## 2023-12-14 VITALS — BP 122/78 | HR 63 | Temp 98.2°F | Ht 63.0 in | Wt 175.2 lb

## 2023-12-14 DIAGNOSIS — Z131 Encounter for screening for diabetes mellitus: Secondary | ICD-10-CM | POA: Diagnosis not present

## 2023-12-14 DIAGNOSIS — E890 Postprocedural hypothyroidism: Secondary | ICD-10-CM

## 2023-12-14 DIAGNOSIS — Z0000 Encounter for general adult medical examination without abnormal findings: Secondary | ICD-10-CM | POA: Diagnosis not present

## 2023-12-14 DIAGNOSIS — E2839 Other primary ovarian failure: Secondary | ICD-10-CM

## 2023-12-14 DIAGNOSIS — I10 Essential (primary) hypertension: Secondary | ICD-10-CM | POA: Diagnosis not present

## 2023-12-14 DIAGNOSIS — M85859 Other specified disorders of bone density and structure, unspecified thigh: Secondary | ICD-10-CM

## 2023-12-14 DIAGNOSIS — E7800 Pure hypercholesterolemia, unspecified: Secondary | ICD-10-CM | POA: Diagnosis not present

## 2023-12-14 DIAGNOSIS — E66811 Obesity, class 1: Secondary | ICD-10-CM

## 2023-12-14 DIAGNOSIS — Z1211 Encounter for screening for malignant neoplasm of colon: Secondary | ICD-10-CM

## 2023-12-14 DIAGNOSIS — E6609 Other obesity due to excess calories: Secondary | ICD-10-CM

## 2023-12-14 DIAGNOSIS — M25511 Pain in right shoulder: Secondary | ICD-10-CM

## 2023-12-14 DIAGNOSIS — Z23 Encounter for immunization: Secondary | ICD-10-CM

## 2023-12-14 DIAGNOSIS — G8929 Other chronic pain: Secondary | ICD-10-CM

## 2023-12-14 DIAGNOSIS — M25512 Pain in left shoulder: Secondary | ICD-10-CM

## 2023-12-14 DIAGNOSIS — J4520 Mild intermittent asthma, uncomplicated: Secondary | ICD-10-CM

## 2023-12-14 DIAGNOSIS — Z6831 Body mass index (BMI) 31.0-31.9, adult: Secondary | ICD-10-CM

## 2023-12-14 MED ORDER — LISINOPRIL 5 MG PO TABS: ORAL_TABLET | ORAL | 3 refills | Status: AC

## 2023-12-14 MED ORDER — ALBUTEROL SULFATE HFA 108 (90 BASE) MCG/ACT IN AERS: INHALATION_SPRAY | RESPIRATORY_TRACT | 3 refills | Status: DC

## 2023-12-14 MED ORDER — BUDESONIDE-FORMOTEROL FUMARATE 80-4.5 MCG/ACT IN AERO: 2.0000 | INHALATION_SPRAY | Freq: Two times a day (BID) | RESPIRATORY_TRACT | 3 refills | Status: AC

## 2023-12-14 NOTE — Assessment & Plan Note (Signed)
Colonoscopy utd from 11/2020

## 2023-12-14 NOTE — Assessment & Plan Note (Signed)
 Refilled symbicort to use bid  Sometimes every day - if bid use causes hoarseness

## 2023-12-14 NOTE — Assessment & Plan Note (Signed)
 Discussed how this problem influences overall health and the risks it imposes  Reviewed plan for weight loss with lower calorie diet (via better food choices (lower glycemic and portion control) along with exercise building up to or more than 30 minutes 5 days per week including some aerobic activity and strength training

## 2023-12-14 NOTE — Assessment & Plan Note (Signed)
 Lab Results  Component Value Date   HGBA1C 5.8 12/07/2023   disc imp of low glycemic diet and wt loss to prevent DM2

## 2023-12-14 NOTE — Assessment & Plan Note (Signed)
 Chronic  Has done PT  Urged to consider sport med visit in future if still bothersome

## 2023-12-14 NOTE — Assessment & Plan Note (Signed)
 Dexa 01/2023 reviewed  Worsened  No falls or fracture Discussed fall prevention, supplements and exercise for bone density   Given handout re: alendronate to consider  No impending dental work   Pt will call if she wants to try this

## 2023-12-14 NOTE — Assessment & Plan Note (Signed)
 Hypothyroidism  Pt has no clinical changes No change in energy level/ hair or skin/ edema and no tremor Lab Results  Component Value Date   TSH 1.78 12/07/2023  Continue levothyroxine 112 mcg daily

## 2023-12-14 NOTE — Assessment & Plan Note (Signed)
 bp in fair control at this time  BP Readings from Last 1 Encounters:  12/14/23 122/78   No changes needed Most recent labs reviewed  Disc lifstyle change with low sodium diet and exercise  Plan to continue lisinopril 5 mg daily

## 2023-12-14 NOTE — Assessment & Plan Note (Signed)
 Dexa reviewed

## 2023-12-14 NOTE — Assessment & Plan Note (Signed)
 Disc goals for lipids and reasons to control them Rev last labs with pt Rev low sat fat diet in detail  HDL imporved LDL stable in 140s  Would like to avoid statins   Discussed opt of cardiac ca score-given handout and pt will call if she wants to schedule it

## 2023-12-14 NOTE — Patient Instructions (Addendum)
 If you are interested in the new shingles vaccine (Shingrix) - call your local pharmacy to check on coverage and availability   Stay active Add some strength training to your routine, this is important for bone and brain health and can reduce your risk of falls and help your body use insulin properly and regulate weight  Light weights, exercise bands , and internet videos are a good way to start  Yoga (chair or regular), machines , floor exercises or a gym with machines are also good options   Alendronate is option for bone density -read about it and let us know if you are interested (5 year course)    To prevent diabetes Try to get most of your carbohydrates from produce (with the exception of white potatoes) and whole grains Eat less bread/pasta/rice/snack foods/cereals/sweets and other items from the middle of the grocery store (processed carbs)  Let me know if you want to get a coronary calcium score test / it usually runs about 200$   If you want to see a podiatrist for foot pain in the future let us know    Flu shot today

## 2023-12-14 NOTE — Assessment & Plan Note (Signed)
 Reviewed health habits including diet and exercise and skin cancer prevention Reviewed appropriate screening tests for age  Also reviewed health mt list, fam hx and immunization status , as well as social and family history   See HPI Labs reviewed and ordered Health Maintenance  Topic Date Due   Zoster (Shingles) Vaccine (1 of 2) 03/15/2024*   Hepatitis C Screening  12/13/2024*   COVID-19 Vaccine (4 - 2024-25 season) 12/29/2024*   Mammogram  01/28/2024   Medicare Annual Wellness Visit  05/31/2024   Colon Cancer Screening  11/15/2030   DTaP/Tdap/Td vaccine (4 - Td or Tdap) 12/03/2031   Pneumonia Vaccine  Completed   Flu Shot  Completed   DEXA scan (bone density measurement)  Completed   HPV Vaccine  Aged Out  *Topic was postponed. The date shown is not the original due date.   Declines hep C screen- is low risk Interested in shingrix vaccine/will ask at pharmacy  Flu shot tiven  Given info on cardiac ca scan   PHQ 0

## 2023-12-14 NOTE — Progress Notes (Signed)
 Subjective:    Patient ID: Jodi Steele, female    DOB: [birth year], 73 y.o.   MRN: [id number]  HPI  Here for health maintenance exam and to review chronic medical problems   Wt Readings from Last 3 Encounters:  12/14/23 175 lb 4 oz (79.5 kg)  12/09/22 173 lb 2 oz (78.5 kg)  12/02/21 176 lb (79.8 kg)   31.04 kg/m  Vitals:   12/14/23 1401  BP: 122/78  Pulse: 63  Temp: 98.2 F (36.8 C)  SpO2: 98%    Immunization History  Administered Date(s) Administered   Fluad Trivalent(High Dose 65+) 12/14/2023   Influenza Split 07/31/2011   Influenza Whole 07/14/2007   Influenza, High Dose Seasonal PF 07/19/2019   Influenza,inj,Quad PF,6+ Mos 09/14/2017   Influenza-Unspecified 07/26/2015, 07/29/2018, 07/12/2020   Moderna Sars-Covid-2 Vaccination 12/05/2019, 01/03/2020, 10/18/2020   Pneumococcal Conjugate-13 09/14/2017   Pneumococcal Polysaccharide-23 09/15/2018   Td 02/17/2005   Tdap 07/28/2012, 12/02/2021   Zoster, Live 12/25/2013    There are no preventive care reminders to display for this patient.  Shingrix-not done   Had live vaccine in the past   Flu shot -wants today    Mammogram 01/2023 Self breast exam- no lumps   Gyn health No problems     Colon cancer screening  Colonoscopy 11/2020   Bone health  Dexa  01/2023  osteopenia worse  Falls- none  Fractures-none  Supplements ca and D  Shoulder problems prohibit some exercise  Does some balance /posture work   May be interested in alendronate/ wants to learn more    Hep c screen -thinks she is low risk    Mood    06/01/2023    8:53 AM 12/09/2022    2:03 PM 12/02/2021   11:08 AM 11/29/2020   10:44 AM 11/10/2019    8:54 AM  Depression screen PHQ 2/9  Decreased Interest 0 0 0 1 0  Down, Depressed, Hopeless 0 0 0 1 1  PHQ - 2 Score 0 0 0 2 1  Altered sleeping 0 0  1 1  Tired, decreased energy 0 2  1 1   Change in appetite 0 0  2 1  Feeling bad or failure about yourself  0 0  1 1  Trouble  concentrating 0 0  0 0  Moving slowly or fidgety/restless 0 0  0 0  Suicidal thoughts 0 0  0 0  PHQ-9 Score 0 2  7 5   Difficult doing work/chores Not difficult at all Somewhat difficult  Not difficult at all Not difficult at all    HTN bp is stable today  No cp or palpitations or headaches or edema  No side effects to medicines  BP Readings from Last 3 Encounters:  12/14/23 122/78  12/09/22 131/80  12/02/21 130/82    Lisinopril 5 mg daily   Hypothyroidism  Pt has no clinical changes No change in energy level/ hair or skin/ edema and no tremor Lab Results  Component Value Date   TSH 1.78 12/07/2023    Levothyroxine 112 mg  daily    DM screening  Lab Results  Component Value Date   HGBA1C 5.8 12/07/2023  Ate a lot of sugar around the holidays      Hyperlipidemia Lab Results  Component Value Date   CHOL 222 (H) 12/07/2023   CHOL 221 (H) 11/28/2022   CHOL 238 (H) 11/25/2021   Lab Results  Component Value Date   HDL 60.30 12/07/2023   HDL 52.40  11/28/2022   HDL 57.40 11/25/2021   Lab Results  Component Value Date   LDLCALC 143 (H) 12/07/2023   LDLCALC 147 (H) 11/28/2022   LDLCALC 145 (H) 11/25/2021   Lab Results  Component Value Date   TRIG 93.0 12/07/2023   TRIG 106.0 11/28/2022   TRIG 179.0 (H) 11/25/2021   Lab Results  Component Value Date   CHOLHDL 4 12/07/2023   CHOLHDL 4 11/28/2022   CHOLHDL 4 11/25/2021   Lab Results  Component Value Date   LDLDIRECT 144.4 12/06/2013   LDLDIRECT 128.3 11/17/2011   LDLDIRECT 151.7 08/15/2011   Declines statin or other treatment in past       Patient Active Problem List   Diagnosis Date Noted   Diabetes mellitus screening 11/22/2023   Encounter for screening mammogram for breast cancer 12/09/2022   Shoulder pain, bilateral 12/09/2022   Osteopenia 01/18/2018   Estrogen deficiency 09/14/2017   Colon cancer screening 06/27/2015   Encounter for routine gynecological examination 12/10/2012   Obesity  11/28/2011   Routine general medical examination at a health care facility 08/14/2011   Hypothyroidism 09/04/2008   Pure hypercholesterolemia 09/04/2008   HYPERTENSION, BENIGN ESSENTIAL 04/20/2007   Asthma 04/07/2007   FIBROCYSTIC BREAST DISEASE 04/07/2007   BUNION 04/07/2007   Past Medical History:  Diagnosis Date   Allergy 1970   cats and dogs   Arthritis    Asthma    related to pet/dog exposure   Bunion    Colon polyp    Depression    Diffuse cystic mastopathy    Goiter, unspecified    surgery   HTN (hypertension)    Hypothyroidism    Pure hypercholesterolemia    Past Surgical History:  Procedure Laterality Date   BREAST BIOPSY Right 1985   abcsess   BREAST CYST ASPIRATION Left    negative   COLONOSCOPY  11/10   polyp   INCISION AND DRAINAGE BREAST ABSCESS     right   THYROIDECTOMY     TUBAL LIGATION     Social History   Tobacco Use   Smoking status: Former    Current packs/day: 0.00    Average packs/day: 1 pack/day for 15.0 years (15.0 ttl pk-yrs)    Types: Cigarettes    Quit date: 10/13/1988    Years since quitting: 35.1    Passive exposure: Past   Smokeless tobacco: Never  Vaping Use   Vaping status: Never Used  Substance Use Topics   Alcohol use: Yes    Alcohol/week: 2.0 standard drinks of alcohol    Types: 1 Glasses of wine, 1 Standard drinks or equivalent per week    Comment: daily usually wine   Drug use: No   Family History  Problem Relation Age of Onset   Other Father        Mesothelioma   Cancer Father    Thyroid cancer Mother        Anaplastic thyroid cancer   Hypertension Mother    Diabetes Mother    Cancer Mother    Thyroid cancer Sister    Cancer Sister    Diabetes Other        GF   Diabetes Maternal Grandfather    Cancer Maternal Grandmother    Varicose Veins Maternal Grandmother    Depression Sister    Hearing loss Sister    Breast cancer Neg Hx    No Known Allergies Current Outpatient Medications on File Prior to Visit   Medication Sig Dispense Refill  Ascorbic Acid (VITAMIN C) 1000 MG tablet Take 1,000 mg by mouth daily.     Calcium Carbonate (CALCIUM 600 PO) Take 1,200 mg by mouth daily.     levothyroxine (SYNTHROID) 112 MCG tablet Take 1 tablet (112 mcg total) by mouth daily. 90 tablet 3   No current facility-administered medications on file prior to visit.    Review of Systems  Constitutional:  Negative for activity change, appetite change, fatigue, fever and unexpected weight change.  HENT:  Negative for congestion, ear pain, rhinorrhea, sinus pressure and sore throat.   Eyes:  Negative for pain, redness and visual disturbance.  Respiratory:  Negative for cough, shortness of breath and wheezing.   Cardiovascular:  Negative for chest pain and palpitations.  Gastrointestinal:  Negative for abdominal pain, blood in stool, constipation and diarrhea.  Endocrine: Negative for polydipsia and polyuria.  Genitourinary:  Negative for dysuria, frequency and urgency.  Musculoskeletal:  Positive for arthralgias. Negative for back pain and myalgias.       Shoulder pain   Skin:  Negative for pallor and rash.  Allergic/Immunologic: Negative for environmental allergies.  Neurological:  Negative for dizziness, syncope and headaches.  Hematological:  Negative for adenopathy. Does not bruise/bleed easily.  Psychiatric/Behavioral:  Negative for decreased concentration and dysphoric mood. The patient is not nervous/anxious.        Objective:   Physical Exam Constitutional:      General: She is not in acute distress.    Appearance: Normal appearance. She is well-developed. She is obese. She is not ill-appearing or diaphoretic.  HENT:     Head: Normocephalic and atraumatic.     Right Ear: Tympanic membrane, ear canal and external ear normal.     Left Ear: Tympanic membrane, ear canal and external ear normal.     Nose: Nose normal. No congestion.     Mouth/Throat:     Mouth: Mucous membranes are moist.      Pharynx: Oropharynx is clear. No posterior oropharyngeal erythema.  Eyes:     General: No scleral icterus.    Extraocular Movements: Extraocular movements intact.     Conjunctiva/sclera: Conjunctivae normal.     Pupils: Pupils are equal, round, and reactive to light.  Neck:     Thyroid: No thyromegaly.     Vascular: No carotid bruit or JVD.  Cardiovascular:     Rate and Rhythm: Normal rate and regular rhythm.     Pulses: Normal pulses.     Heart sounds: Normal heart sounds.     No gallop.  Pulmonary:     Effort: Pulmonary effort is normal. No respiratory distress.     Breath sounds: Normal breath sounds. No wheezing.     Comments: Good air exch Chest:     Chest wall: No tenderness.  Abdominal:     General: Bowel sounds are normal. There is no distension or abdominal bruit.     Palpations: Abdomen is soft. There is no mass.     Tenderness: There is no abdominal tenderness.     Hernia: No hernia is present.  Genitourinary:    Comments: Breast exam: No mass, nodules, thickening, tenderness, bulging, retraction, inflamation, nipple discharge or skin changes noted.  No axillary or clavicular LA.     Musculoskeletal:        General: No tenderness. Normal range of motion.     Cervical back: Normal range of motion and neck supple. No rigidity. No muscular tenderness.     Right lower leg: No edema.  Left lower leg: No edema.     Comments: No kyphosis   Limited abd of shoulders   Lymphadenopathy:     Cervical: No cervical adenopathy.  Skin:    General: Skin is warm and dry.     Coloration: Skin is not pale.     Findings: No erythema or rash.     Comments: Solar lentigines diffusely   Neurological:     Mental Status: She is alert. Mental status is at baseline.     Cranial Nerves: No cranial nerve deficit.     Motor: No abnormal muscle tone.     Coordination: Coordination normal.     Gait: Gait normal.     Deep Tendon Reflexes: Reflexes are normal and symmetric. Reflexes  normal.  Psychiatric:        Mood and Affect: Mood normal.        Cognition and Memory: Cognition and memory normal.           Assessment & Plan:   Problem List Items Addressed This Visit       Cardiovascular and Mediastinum   HYPERTENSION, BENIGN ESSENTIAL   bp in fair control at this time  BP Readings from Last 1 Encounters:  12/14/23 122/78   No changes needed Most recent labs reviewed  Disc lifstyle change with low sodium diet and exercise  Plan to continue lisinopril 5 mg daily       Relevant Medications   lisinopril (ZESTRIL) 5 MG tablet     Respiratory   Asthma   Refilled symbicort to use bid  Sometimes every day - if bid use causes hoarseness       Relevant Medications   albuterol (VENTOLIN HFA) 108 (90 Base) MCG/ACT inhaler   budesonide-formoterol (SYMBICORT) 80-4.5 MCG/ACT inhaler     Endocrine   Hypothyroidism   Hypothyroidism  Pt has no clinical changes No change in energy level/ hair or skin/ edema and no tremor Lab Results  Component Value Date   TSH 1.78 12/07/2023  Continue levothyroxine 112 mcg daily            Musculoskeletal and Integument   Osteopenia   Dexa 01/2023 reviewed  Worsened  No falls or fracture Discussed fall prevention, supplements and exercise for bone density   Given handout re: alendronate to consider  No impending dental work   Pt will call if she wants to try this         Other   Shoulder pain, bilateral   Chronic  Has done PT  Urged to consider sport med visit in future if still bothersome       Routine general medical examination at a health care facility - Primary   Reviewed health habits including diet and exercise and skin cancer prevention Reviewed appropriate screening tests for age  Also reviewed health mt list, fam hx and immunization status , as well as social and family history   See HPI Labs reviewed and ordered Health Maintenance  Topic Date Due   Zoster (Shingles) Vaccine (1 of 2)  03/15/2024*   Hepatitis C Screening  12/13/2024*   COVID-19 Vaccine (4 - 2024-25 season) 12/29/2024*   Mammogram  01/28/2024   Medicare Annual Wellness Visit  05/31/2024   Colon Cancer Screening  11/15/2030   DTaP/Tdap/Td vaccine (4 - Td or Tdap) 12/03/2031   Pneumonia Vaccine  Completed   Flu Shot  Completed   DEXA scan (bone density measurement)  Completed   HPV Vaccine  Aged Out  *  Topic was postponed. The date shown is not the original due date.   Declines hep C screen- is low risk Interested in shingrix vaccine/will ask at pharmacy  Flu shot tiven  Given info on cardiac ca scan   PHQ 0       Pure hypercholesterolemia   Disc goals for lipids and reasons to control them Rev last labs with pt Rev low sat fat diet in detail  HDL imporved LDL stable in 140s  Would like to avoid statins   Discussed opt of cardiac ca score-given handout and pt will call if she wants to schedule it       Relevant Medications   lisinopril (ZESTRIL) 5 MG tablet   Obesity   Discussed how this problem influences overall health and the risks it imposes  Reviewed plan for weight loss with lower calorie diet (via better food choices (lower glycemic and portion control) along with exercise building up to or more than 30 minutes 5 days per week including some aerobic activity and strength training         Estrogen deficiency   Dexa reviewed       Diabetes mellitus screening   Lab Results  Component Value Date   HGBA1C 5.8 12/07/2023   disc imp of low glycemic diet and wt loss to prevent DM2       Colon cancer screening   Colonoscopy utd from 11/2020      Other Visit Diagnoses       Need for influenza vaccination       Relevant Orders   Flu Vaccine Trivalent High Dose (Fluad) (Completed)

## 2024-01-11 ENCOUNTER — Other Ambulatory Visit: Payer: Self-pay | Admitting: Family Medicine

## 2024-01-11 DIAGNOSIS — Z1231 Encounter for screening mammogram for malignant neoplasm of breast: Secondary | ICD-10-CM

## 2024-01-29 ENCOUNTER — Other Ambulatory Visit: Payer: Self-pay | Admitting: Family Medicine

## 2024-02-03 ENCOUNTER — Ambulatory Visit
Admission: RE | Admit: 2024-02-03 | Discharge: 2024-02-03 | Disposition: A | Discharge status: Home / Self Care | Source: Ambulatory Visit | Attending: Family Medicine | Admitting: Family Medicine

## 2024-02-03 DIAGNOSIS — Z1231 Encounter for screening mammogram for malignant neoplasm of breast: Secondary | ICD-10-CM | POA: Insufficient documentation

## 2024-02-05 ENCOUNTER — Encounter: Payer: Self-pay | Admitting: Family Medicine

## 2024-02-25 ENCOUNTER — Other Ambulatory Visit: Payer: Self-pay | Admitting: Family Medicine

## 2024-06-08 ENCOUNTER — Ambulatory Visit (INDEPENDENT_AMBULATORY_CARE_PROVIDER_SITE_OTHER): Payer: Medicare HMO

## 2024-06-08 VITALS — Ht 63.0 in | Wt 171.0 lb

## 2024-06-08 DIAGNOSIS — Z0000 Encounter for general adult medical examination without abnormal findings: Secondary | ICD-10-CM

## 2024-06-08 NOTE — Progress Notes (Signed)
 Subjective:   Jodi Steele is a 73 y.o. who presents for a Medicare Wellness preventive visit.  As a reminder, Annual Wellness Visits don't include a physical exam, and some assessments may be limited, especially if this visit is performed virtually. We may recommend an in-person follow-up visit with your provider if needed.  Visit Complete: Virtual I connected with  Wanda DELENA Dutko on 06/08/24 by a audio enabled telemedicine application and verified that I am speaking with the correct person using two identifiers.  Patient Location: Home  Provider Location: Office/Clinic  I discussed the limitations of evaluation and management by telemedicine. The patient expressed understanding and agreed to proceed.  Vital Signs: Because this visit was a virtual/telehealth visit, some criteria may be missing or patient reported. Any vitals not documented were not able to be obtained and vitals that have been documented are patient reported.  VideoDeclined- This patient declined Librarian, academic. Therefore the visit was completed with audio only.  Persons Participating in Visit: Patient.  AWV Questionnaire: No: Patient Medicare AWV questionnaire was not completed prior to this visit.  Cardiac Risk Factors include: advanced age (>85men, >62 women);hypertension;obesity (BMI >30kg/m2)     Objective:    Today's Vitals   06/08/24 0902  Weight: 171 lb (77.6 kg)  Height: 5' 3 (1.6 m)   Body mass index is 30.29 kg/m.     06/08/2024    9:11 AM 06/01/2023    8:50 AM  Advanced Directives  Does Patient Have a Medical Advance Directive? Yes Yes  Type of Estate agent of [employer] Power of [place];Living will  Copy of Healthcare Power of Attorney in Chart? No - copy requested No - copy requested    Current Medications (verified) Outpatient Encounter Medications as of 06/08/2024  Medication Sig   albuterol  (VENTOLIN  HFA) 108  (90 Base) MCG/ACT inhaler INHALE 2 PUFFS BY MOUTH EVERY 4 HOURS AS NEEDED   Ascorbic Acid (VITAMIN C) 1000 MG tablet Take 1,000 mg by mouth daily.   budesonide -formoterol  (SYMBICORT ) 80-4.5 MCG/ACT inhaler Inhale 2 puffs into the lungs 2 (two) times daily.   Calcium Carbonate (CALCIUM 600 PO) Take 1,200 mg by mouth daily.   levothyroxine  (SYNTHROID ) 112 MCG tablet Take 1 tablet (112 mcg total) by mouth daily before breakfast.   lisinopril  (ZESTRIL ) 5 MG tablet TAKE 1 TABLET(5 MG) BY MOUTH DAILY   No facility-administered encounter medications on file as of 06/08/2024.    Allergies (verified) Patient has no known allergies.   History: Past Medical History:  Diagnosis Date   Allergy 1970   cats and dogs   Arthritis    Asthma    related to pet/dog exposure   Bunion    Colon polyp    Depression    Diffuse cystic mastopathy    Goiter, unspecified    surgery   HTN (hypertension)    Hypothyroidism    Pure hypercholesterolemia    Past Surgical History:  Procedure Laterality Date   BREAST BIOPSY Right 1985   abcsess   BREAST CYST ASPIRATION Left    negative   COLONOSCOPY  11/10   polyp   INCISION AND DRAINAGE BREAST ABSCESS     right   THYROIDECTOMY     TUBAL LIGATION     Family History  Problem Relation Age of Onset   Other Father        Mesothelioma   Cancer Father    Thyroid  cancer Mother  Anaplastic thyroid  cancer   Hypertension Mother    Diabetes Mother    Cancer Mother    Thyroid  cancer Sister    Cancer Sister    Diabetes Other        GF   Diabetes Maternal Grandfather    Cancer Maternal Grandmother    Varicose Veins Maternal Grandmother    Depression Sister    Hearing loss Sister    Breast cancer Neg Hx    Social History   Socioeconomic History   Marital status: Widowed    Spouse name: Not on file   Number of children: 2   Years of education: Not on file   Highest education level: Bachelor's degree (e.g., BA, AB, BS)  Occupational History    Occupation: Probation officer artisit  Tobacco Use   Smoking status: Former    Current packs/day: 0.00    Average packs/day: 1 pack/day for 15.0 years (15.0 ttl pk-yrs)    Types: Cigarettes    Quit date: 10/13/1988    Years since quitting: 35.6    Passive exposure: Past   Smokeless tobacco: Never  Vaping Use   Vaping status: Never Used  Substance and Sexual Activity   Alcohol use: Yes    Alcohol/week: 2.0 standard drinks of alcohol    Types: 1 Glasses of wine, 1 Standard drinks or equivalent per week    Comment: daily usually wine   Drug use: No   Sexual activity: Not Currently  Other Topics Concern   Not on file  Social History Narrative   Married; 2 sons      Sports coach         Social Drivers of Health   Financial Resource Strain: Low Risk  (06/08/2024)   Overall Financial Resource Strain (CARDIA)    Difficulty of Paying Living Expenses: Not hard at all  Food Insecurity: No Food Insecurity (06/08/2024)   Hunger Vital Sign    Worried About Running Out of Food in the Last Year: Never true    Ran Out of Food in the Last Year: Never true  Transportation Needs: No Transportation Needs (06/08/2024)   PRAPARE - Administrator, Civil Service (Medical): No    Lack of Transportation (Non-Medical): No  Physical Activity: Sufficiently Active (06/08/2024)   Exercise Vital Sign    Days of Exercise per Week: 6 days    Minutes of Exercise per Session: 30 min  Stress: Stress Concern Present (06/08/2024)   [employer] of Occupational Health - Occupational Stress Questionnaire    Feeling of Stress: To some extent  Social Connections: Socially Isolated (06/08/2024)   Social Connection and Isolation Panel    Frequency of Communication with Friends and Family: Once a week    Frequency of Social Gatherings with Friends and Family: Once a week    Attends Religious Services: Never    Database administrator or Organizations: Yes    Attends Museum/gallery exhibitions officer: More than 4 times per year    Marital Status: Widowed    Tobacco Counseling Counseling given: Not Answered    Clinical Intake:  Pre-visit preparation completed: Yes  Pain : No/denies pain     BMI - recorded: 30.29 Nutritional Status: BMI > 30  Obese Nutritional Risks: None Diabetes: No  Lab Results  Component Value Date   HGBA1C 5.8 12/07/2023     How often do you need to have someone help you when you read instructions, pamphlets, or other written  materials from your doctor or pharmacy?: 1 - Never  Interpreter Needed?: No  Comments: lives alone Information entered by :: B.Teia Freitas,LPN   Activities of Daily Living     06/08/2024    9:13 AM  In your present state of health, do you have any difficulty performing the following activities:  Hearing? 0  Vision? 0  Difficulty concentrating or making decisions? 1  Walking or climbing stairs? 0  Dressing or bathing? 0  Doing errands, shopping? 0  Preparing Food and eating ? N  Using the Toilet? N  In the past six months, have you accidently leaked urine? Y  Comment laughing, sneezing  Do you have problems with loss of bowel control? N  Managing your Medications? N  Managing your Finances? N  Housekeeping or managing your Housekeeping? N    Patient Care Team: [place], Laine LABOR, MD as PCP - General Bulakowski, [place], OD (Optometry)  I have updated your Care Teams any recent Medical Services you may have received from other providers in the past year.     Assessment:   This is a routine wellness examination for [place].  Hearing/Vision screen Hearing Screening - Comments:: Patient denies any hearing difficulties.   Vision Screening - Comments:: Pt says their vision is good with glasses Dr  Portia   Goals Addressed             This Visit's Progress    Patient Stated       06/08/24, wants to lose 20 pounds       Depression Screen     06/08/2024    9:09 AM 06/01/2023     8:53 AM 12/09/2022    2:03 PM 12/02/2021   11:08 AM 11/29/2020   10:44 AM 11/10/2019    8:54 AM 09/15/2018   12:36 PM  PHQ 2/9 Scores  PHQ - 2 Score 0 0 0 0 2 1 1   PHQ- 9 Score  0 2  7 5 5     Fall Risk     06/08/2024    9:04 AM 06/01/2023    8:51 AM 12/09/2022    2:03 PM 09/14/2017   12:23 PM  Fall Risk   Falls in the past year? 0 0 0 Yes   Number falls in past yr: 0 0 0 1   Injury with Fall? 0 0 0 No   Risk for fall due to : No Fall Risks Medication side effect No Fall Risks   Follow up Education provided;Falls prevention discussed Falls prevention discussed;Falls evaluation completed Falls evaluation completed Falls evaluation completed      Data saved with a previous flowsheet row definition    MEDICARE RISK AT HOME:  Medicare Risk at Home Any stairs in or around the home?: Yes If so, are there any without handrails?: Yes Home free of loose throw rugs in walkways, pet beds, electrical cords, etc?: Yes Adequate lighting in your home to reduce risk of falls?: Yes Life alert?: No Use of a cane, walker or w/c?: No Grab bars in the bathroom?: Yes Shower chair or bench in shower?: Yes Elevated toilet seat or a handicapped toilet?: Yes  TIMED UP AND GO:  Was the test performed?  No  Cognitive Function: 6CIT completed        06/08/2024    9:15 AM 06/01/2023    8:54 AM  6CIT Screen  What Year? 0 points 0 points  What month? 0 points 0 points  What time? 0 points 0 points  Count  back from 20 0 points 0 points  Months in reverse 0 points 0 points  Repeat phrase 0 points 0 points  Total Score 0 points 0 points    Immunizations Immunization History  Administered Date(s) Administered   Fluad Trivalent(High Dose 65+) 12/14/2023   INFLUENZA, HIGH DOSE SEASONAL PF 07/19/2019   Influenza Split 07/31/2011   Influenza Whole 07/14/2007   Influenza,inj,Quad PF,6+ Mos 09/14/2017   Influenza-Unspecified 07/26/2015, 07/29/2018, 07/12/2020   Moderna Sars-Covid-2 Vaccination  12/05/2019, 01/03/2020, 10/18/2020   Pneumococcal Conjugate-13 09/14/2017   Pneumococcal Polysaccharide-23 09/15/2018   Td 02/17/2005   Tdap 07/28/2012, 12/02/2021   Zoster, Live 12/25/2013    Screening Tests Health Maintenance  Topic Date Due   Zoster Vaccines- Shingrix (1 of 2) 01/04/2002   INFLUENZA VACCINE  05/13/2024   Hepatitis C Screening  12/13/2024 (Originally 01/04/1969)   COVID-19 Vaccine (4 - 2024-25 season) 12/29/2024 (Originally 06/14/2023)   MAMMOGRAM  02/02/2025   Medicare Annual Wellness (AWV)  06/08/2025   Colonoscopy  11/15/2030   DTaP/Tdap/Td (4 - Td or Tdap) 12/03/2031   Pneumococcal Vaccine: 50+ Years  Completed   DEXA SCAN  Completed   HPV VACCINES  Aged Out   Meningococcal B Vaccine  Aged Out    Health Maintenance  Health Maintenance Due  Topic Date Due   Zoster Vaccines- Shingrix (1 of 2) 01/04/2002   INFLUENZA VACCINE  05/13/2024   Health Maintenance Items Addressed: None needed at this time  Additional Screening:  Vision Screening: Recommended annual ophthalmology exams for early detection of glaucoma and other disorders of the eye. Would you like a referral to an eye doctor? No    Dental Screening: Recommended annual dental exams for proper oral hygiene  Community Resource Referral / Chronic Care Management: CRR required this visit?  No   CCM required this visit?  No   Plan:    I have personally reviewed and noted the following in the patient's chart:   Medical and social history Use of alcohol, tobacco or illicit drugs  Current medications and supplements including opioid prescriptions. Patient is not currently taking opioid prescriptions. Functional ability and status Nutritional status Physical activity Advanced directives List of other physicians Hospitalizations, surgeries, and ER visits in previous 12 months Vitals Screenings to include cognitive, depression, and falls Referrals and appointments  In addition, I have  reviewed and discussed with patient certain preventive protocols, quality metrics, and best practice recommendations. A written personalized care plan for preventive services as well as general preventive health recommendations were provided to patient.   Erminio LITTIE Saris, LPN   [id number]/72/7974   After Visit Summary: (MyChart) Due to this being a telephonic visit, the after visit summary with patients personalized plan was offered to patient via MyChart   Notes: Nothing significant to report at this time.

## 2024-06-08 NOTE — Patient Instructions (Signed)
 Ms. Jodi Steele , Thank you for taking time out of your busy schedule to complete your Annual Wellness Visit with me. I enjoyed our conversation and look forward to speaking with you again next year. I, as well as your care team,  appreciate your ongoing commitment to your health goals. Please review the following plan we discussed and let me know if I can assist you in the future. Your Game plan/ To Do List    Referrals: If you haven't heard from the office you've been referred to, please reach out to them at the phone provided.   Follow up Visits: We will see or speak with you next year for your Next Medicare AWV with our clinical staff Have you seen your provider in the last 6 months (3 months if uncontrolled diabetes)? Yes  Clinician Recommendations:  Aim for 30 minutes of exercise or brisk walking, 6-8 glasses of water, and 5 servings of fruits and vegetables each day.       This is a list of the screenings recommended for you:  Health Maintenance  Topic Date Due   Zoster (Shingles) Vaccine (1 of 2) 01/04/2002   Flu Shot  05/13/2024   Hepatitis C Screening  12/13/2024*   COVID-19 Vaccine (4 - 2024-25 season) 12/29/2024*   Mammogram  02/02/2025   Medicare Annual Wellness Visit  06/08/2025   Colon Cancer Screening  11/15/2030   DTaP/Tdap/Td vaccine (4 - Td or Tdap) 12/03/2031   Pneumococcal Vaccine for age over 65  Completed   DEXA scan (bone density measurement)  Completed   HPV Vaccine  Aged Out   Meningitis B Vaccine  Aged Out  *Topic was postponed. The date shown is not the original due date.    Advanced directives: (Copy Requested) Please bring a copy of your health care power of attorney and living will to the office to be added to your chart at your convenience. You can mail to [facility] [address] [place], [place] [address] or email to [email or web address] Advance Care Planning is important because it:  [x]  Makes sure you receive the medical care  that is consistent with your values, goals, and preferences  [x]  It provides guidance to your family and loved ones and reduces their decisional burden about whether or not they are making the right decisions based on your wishes.  Follow the link provided in your after visit summary or read over the paperwork we have mailed to you to help you started getting your Advance Directives in place. If you need assistance in completing these, please reach out to us  so that we can help you!

## 2024-07-26 ENCOUNTER — Other Ambulatory Visit: Payer: Self-pay | Admitting: Family Medicine

## 2024-08-09 ENCOUNTER — Other Ambulatory Visit: Payer: Self-pay | Admitting: Medical Genetics

## 2024-08-09 DIAGNOSIS — Z006 Encounter for examination for normal comparison and control in clinical research program: Secondary | ICD-10-CM

## 2024-08-24 ENCOUNTER — Encounter: Payer: Self-pay | Admitting: Family Medicine

## 2024-08-24 DIAGNOSIS — M85859 Other specified disorders of bone density and structure, unspecified thigh: Secondary | ICD-10-CM

## 2024-08-31 ENCOUNTER — Ambulatory Visit: Payer: Self-pay | Admitting: Family Medicine

## 2024-08-31 ENCOUNTER — Other Ambulatory Visit

## 2024-08-31 DIAGNOSIS — M85859 Other specified disorders of bone density and structure, unspecified thigh: Secondary | ICD-10-CM

## 2024-08-31 LAB — VITAMIN D 25 HYDROXY (VIT D DEFICIENCY, FRACTURES): VITD: 45.23 ng/mL (ref 30.00–100.00)

## 2024-12-09 ENCOUNTER — Other Ambulatory Visit

## 2024-12-16 ENCOUNTER — Encounter: Admitting: Family Medicine

## 2025-06-09 ENCOUNTER — Ambulatory Visit
